# Patient Record
Sex: Female | Born: 1950
Health system: Southern US, Community
[De-identification: ages and names within clinical notes are randomized; demographics above are authoritative.]

## PROBLEM LIST (undated history)

## (undated) DIAGNOSIS — Z789 Other specified health status: Secondary | ICD-10-CM

## (undated) DIAGNOSIS — F32A Depression, unspecified: Secondary | ICD-10-CM

## (undated) DIAGNOSIS — I10 Essential (primary) hypertension: Secondary | ICD-10-CM

## (undated) DIAGNOSIS — E785 Hyperlipidemia, unspecified: Secondary | ICD-10-CM

## (undated) HISTORY — DX: Essential (primary) hypertension: I10

## (undated) HISTORY — DX: Hyperlipidemia, unspecified: E78.5

## (undated) HISTORY — DX: Depression, unspecified: F32.A

---

## 2018-10-07 DIAGNOSIS — M26609 Unspecified temporomandibular joint disorder, unspecified side: Secondary | ICD-10-CM | POA: Diagnosis not present

## 2018-10-07 DIAGNOSIS — Z720 Tobacco use: Secondary | ICD-10-CM | POA: Diagnosis not present

## 2018-10-07 DIAGNOSIS — G501 Atypical facial pain: Secondary | ICD-10-CM | POA: Diagnosis not present

## 2018-10-07 DIAGNOSIS — H9209 Otalgia, unspecified ear: Secondary | ICD-10-CM | POA: Diagnosis not present

## 2019-02-11 DIAGNOSIS — H2513 Age-related nuclear cataract, bilateral: Secondary | ICD-10-CM | POA: Diagnosis not present

## 2019-05-16 ENCOUNTER — Ambulatory Visit: Admit: 2019-05-16 | Payer: Self-pay | Admitting: Ophthalmology

## 2019-05-16 SURGERY — PHACOEMULSIFICATION, CATARACT, WITH IOL INSERTION
Anesthesia: Topical | Laterality: Left

## 2019-06-27 DIAGNOSIS — H2512 Age-related nuclear cataract, left eye: Secondary | ICD-10-CM | POA: Diagnosis not present

## 2019-06-27 DIAGNOSIS — R0600 Dyspnea, unspecified: Secondary | ICD-10-CM | POA: Diagnosis not present

## 2019-07-07 ENCOUNTER — Other Ambulatory Visit: Payer: Self-pay

## 2019-07-07 ENCOUNTER — Encounter: Payer: Self-pay | Admitting: *Deleted

## 2019-07-07 NOTE — Discharge Instructions (Signed)

## 2019-07-12 ENCOUNTER — Other Ambulatory Visit
Admission: RE | Admit: 2019-07-12 | Discharge: 2019-07-12 | Disposition: A | Discharge status: Home / Self Care | Payer: Medicare Other | Source: Ambulatory Visit | Attending: Ophthalmology | Admitting: Ophthalmology

## 2019-07-12 ENCOUNTER — Other Ambulatory Visit: Payer: Self-pay

## 2019-07-12 DIAGNOSIS — Z1159 Encounter for screening for other viral diseases: Secondary | ICD-10-CM | POA: Insufficient documentation

## 2019-07-12 LAB — SARS CORONAVIRUS 2 (TAT 6-24 HRS): SARS Coronavirus 2: NEGATIVE

## 2019-07-15 ENCOUNTER — Other Ambulatory Visit: Payer: Self-pay

## 2019-07-15 ENCOUNTER — Ambulatory Visit
Admission: RE | Admit: 2019-07-15 | Discharge: 2019-07-15 | Disposition: A | Discharge status: Home / Self Care | Payer: Medicare Other | Attending: Ophthalmology | Admitting: Ophthalmology

## 2019-07-15 ENCOUNTER — Ambulatory Visit: Payer: Medicare Other | Admitting: Anesthesiology

## 2019-07-15 ENCOUNTER — Encounter
Admission: RE | Disposition: A | Discharge status: Home / Self Care | Payer: Self-pay | Source: Home / Self Care | Attending: Ophthalmology

## 2019-07-15 DIAGNOSIS — F17200 Nicotine dependence, unspecified, uncomplicated: Secondary | ICD-10-CM | POA: Diagnosis not present

## 2019-07-15 DIAGNOSIS — H2512 Age-related nuclear cataract, left eye: Secondary | ICD-10-CM | POA: Diagnosis not present

## 2019-07-15 DIAGNOSIS — H25812 Combined forms of age-related cataract, left eye: Secondary | ICD-10-CM | POA: Diagnosis not present

## 2019-07-15 HISTORY — PX: CATARACT EXTRACTION W/PHACO: SHX586

## 2019-07-15 HISTORY — DX: Other specified health status: Z78.9

## 2019-07-15 SURGERY — PHACOEMULSIFICATION, CATARACT, WITH IOL INSERTION
Anesthesia: Monitor Anesthesia Care | Site: Eye | Laterality: Left

## 2019-07-15 MED ORDER — ARMC OPHTHALMIC DILATING DROPS
1.0000 "application " | OPHTHALMIC | Status: DC | PRN
  Administered 2019-07-15 (×3): 1 via OPHTHALMIC

## 2019-07-15 MED ORDER — SODIUM HYALURONATE 10 MG/ML IO SOLN
INTRAOCULAR | Status: DC | PRN
  Administered 2019-07-15: 0.55 mL via INTRAOCULAR

## 2019-07-15 MED ORDER — TETRACAINE HCL 0.5 % OP SOLN
1.0000 [drp] | OPHTHALMIC | Status: DC | PRN
  Administered 2019-07-15 (×3): 1 [drp] via OPHTHALMIC

## 2019-07-15 MED ORDER — MIDAZOLAM HCL 2 MG/2ML IJ SOLN
INTRAMUSCULAR | Status: DC | PRN
  Administered 2019-07-15: 2 mg via INTRAVENOUS

## 2019-07-15 MED ORDER — SODIUM HYALURONATE 23 MG/ML IO SOLN
INTRAOCULAR | Status: DC | PRN
  Administered 2019-07-15: 0.6 mL via INTRAOCULAR

## 2019-07-15 MED ORDER — EPINEPHRINE PF 1 MG/ML IJ SOLN
INTRAOCULAR | Status: DC | PRN
  Administered 2019-07-15: 98 mL via OPHTHALMIC

## 2019-07-15 MED ORDER — LIDOCAINE HCL (PF) 2 % IJ SOLN
INTRAOCULAR | Status: DC | PRN
  Administered 2019-07-15: 1 mL via INTRAOCULAR

## 2019-07-15 MED ORDER — MOXIFLOXACIN HCL 0.5 % OP SOLN
OPHTHALMIC | Status: DC | PRN
  Administered 2019-07-15: 0.2 mL via OPHTHALMIC

## 2019-07-15 MED ORDER — FENTANYL CITRATE (PF) 100 MCG/2ML IJ SOLN
INTRAMUSCULAR | Status: DC | PRN
  Administered 2019-07-15 (×2): 50 ug via INTRAVENOUS

## 2019-07-15 SURGICAL SUPPLY — 19 items
CANNULA ANT/CHMB 27G (MISCELLANEOUS) ×2 IMPLANT
CANNULA ANT/CHMB 27GA (MISCELLANEOUS) ×6 IMPLANT
DISSECTOR HYDRO NUCLEUS 50X22 (MISCELLANEOUS) ×3 IMPLANT
GLOVE SURG LX 7.5 STRW (GLOVE) ×2
GLOVE SURG LX STRL 7.5 STRW (GLOVE) ×1 IMPLANT
GLOVE SURG SYN 8.5  E (GLOVE) ×2
GLOVE SURG SYN 8.5 E (GLOVE) ×1 IMPLANT
GLOVE SURG SYN 8.5 PF PI (GLOVE) ×1 IMPLANT
GOWN STRL REUS W/ TWL LRG LVL3 (GOWN DISPOSABLE) ×2 IMPLANT
GOWN STRL REUS W/TWL LRG LVL3 (GOWN DISPOSABLE) ×4
LENS IOL TECNIS ITEC 17.0 (Intraocular Lens) ×2 IMPLANT
MARKER SKIN DUAL TIP RULER LAB (MISCELLANEOUS) ×3 IMPLANT
PACK DR. KING ARMS (PACKS) ×3 IMPLANT
PACK EYE AFTER SURG (MISCELLANEOUS) ×3 IMPLANT
PACK OPTHALMIC (MISCELLANEOUS) ×3 IMPLANT
SYR 3ML LL SCALE MARK (SYRINGE) ×3 IMPLANT
SYR TB 1ML LUER SLIP (SYRINGE) ×3 IMPLANT
WATER STERILE IRR 250ML POUR (IV SOLUTION) ×3 IMPLANT
WIPE NON LINTING 3.25X3.25 (MISCELLANEOUS) ×3 IMPLANT

## 2019-07-15 NOTE — Anesthesia Procedure Notes (Signed)
Procedure Name: MAC Performed by: Jamiah Homeyer, CRNA Pre-anesthesia Checklist: Patient identified, Emergency Drugs available, Suction available, Timeout performed and Patient being monitored Patient Re-evaluated:Patient Re-evaluated prior to induction Oxygen Delivery Method: Nasal cannula Placement Confirmation: positive ETCO2       

## 2019-07-15 NOTE — Transfer of Care (Signed)
Immediate Anesthesia Transfer of Care Note  Patient: Suzanne Richardson  Procedure(s) Performed: CATARACT EXTRACTION PHACO AND INTRAOCULAR LENS PLACEMENT (IOC) LEFT (Left Eye)  Patient Location: PACU  Anesthesia Type: MAC  Level of Consciousness: awake, alert  and patient cooperative  Airway and Oxygen Therapy: Patient Spontanous Breathing and Patient connected to supplemental oxygen  Post-op Assessment: Post-op Vital signs reviewed, Patient's Cardiovascular Status Stable, Respiratory Function Stable, Patent Airway and No signs of Nausea or vomiting  Post-op Vital Signs: Reviewed and stable  Complications: No apparent anesthesia complications

## 2019-07-15 NOTE — Op Note (Signed)
OPERATIVE NOTE  Suzanne Richardson [id number] 07/15/2019   PREOPERATIVE DIAGNOSIS:  Nuclear sclerotic cataract left eye.  H25.12   POSTOPERATIVE DIAGNOSIS:    Nuclear sclerotic cataract left eye.     PROCEDURE:  Phacoemusification with posterior chamber intraocular lens placement of the left eye   LENS:   Implant Name Type Inv. Item Serial No. Manufacturer Lot No. LRB No. Used Action  LENS IOL DIOP 17.0 - [id number] Intraocular Lens LENS IOL DIOP 17.0 [id number] AMO  Left 1 Wasted  LENS IOL DIOP 17.0 - [id number] Intraocular Lens LENS IOL DIOP 17.0 [id number] AMO  Left 1 Implanted       PCB00 +17.0   ULTRASOUND TIME: 1 minutes 45 seconds.  CDE 21.19   SURGEON:  Benay Pillow, MD, MPH   ANESTHESIA:  Topical with tetracaine drops augmented with 1% preservative-free intracameral lidocaine.  ESTIMATED BLOOD LOSS: <1 mL   COMPLICATIONS:  None.   DESCRIPTION OF PROCEDURE:  The patient was identified in the holding room and transported to the operating room and placed in the supine position under the operating microscope.  The left eye was identified as the operative eye and it was prepped and draped in the usual sterile ophthalmic fashion.   A 1.0 millimeter clear-corneal paracentesis was made at the 5:00 position. 0.5 ml of preservative-free 1% lidocaine with epinephrine was injected into the anterior chamber.  The anterior chamber was filled with Healon 5 viscoelastic.  A 2.4 millimeter keratome was used to make a near-clear corneal incision at the 2:00 position.  A curvilinear capsulorrhexis was made with a cystotome and capsulorrhexis forceps.  Balanced salt solution was used to hydrodissect and hydrodelineate the nucleus.   Phacoemulsification was then used in stop and chop fashion to remove the lens nucleus and epinucleus.  The remaining cortex was then removed using the irrigation and aspiration handpiece. Healon was then placed into the capsular bag to distend it for lens placement.  A  lens was then injected into the capsular bag.  The remaining viscoelastic was aspirated.   Wounds were hydrated with balanced salt solution.  The anterior chamber was inflated to a physiologic pressure with balanced salt solution.  Intracameral vigamox 0.1 mL undiltued was injected into the eye and a drop placed onto the ocular surface.  No wound leaks were noted.  The patient was taken to the recovery room in stable condition without complications of anesthesia or surgery  Benay Pillow 07/15/2019, 10:58 AM

## 2019-07-15 NOTE — Anesthesia Postprocedure Evaluation (Signed)
Anesthesia Post Note  Patient: Suzanne Richardson  Procedure(s) Performed: CATARACT EXTRACTION PHACO AND INTRAOCULAR LENS PLACEMENT (IOC) LEFT (Left Eye)  Patient location during evaluation: PACU Anesthesia Type: MAC Level of consciousness: awake and alert Pain management: pain level controlled Vital Signs Assessment: post-procedure vital signs reviewed and stable Respiratory status: spontaneous breathing, nonlabored ventilation, respiratory function stable and patient connected to nasal cannula oxygen Cardiovascular status: stable and blood pressure returned to baseline Postop Assessment: no apparent nausea or vomiting Anesthetic complications: no    Trecia Rogers

## 2019-07-15 NOTE — H&P (Signed)

## 2019-07-15 NOTE — Anesthesia Preprocedure Evaluation (Signed)
Anesthesia Evaluation  Patient identified by MRN, date of birth, ID band Patient awake    Reviewed: Allergy & Precautions, H&P , NPO status , Patient's Chart, lab work & pertinent test results, reviewed documented beta blocker date and time   Airway Mallampati: II  TM Distance: >3 FB Neck ROM: full    Dental no notable dental hx.    Pulmonary Current Smoker,    Pulmonary exam normal breath sounds clear to auscultation       Cardiovascular Exercise Tolerance: Good negative cardio ROS Normal cardiovascular exam Rhythm:regular Rate:Normal     Neuro/Psych negative neurological ROS  negative psych ROS   GI/Hepatic negative GI ROS, Neg liver ROS,   Endo/Other  negative endocrine ROS  Renal/GU negative Renal ROS  negative genitourinary   Musculoskeletal   Abdominal   Peds  Hematology negative hematology ROS (+)   Anesthesia Other Findings   Reproductive/Obstetrics negative OB ROS                             Anesthesia Physical Anesthesia Plan  ASA: II  Anesthesia Plan: MAC   Post-op Pain Management:    Induction:   PONV Risk Score and Plan:   Airway Management Planned:   Additional Equipment:   Intra-op Plan:   Post-operative Plan:   Informed Consent: I have reviewed the patients History and Physical, chart, labs and discussed the procedure including the risks, benefits and alternatives for the proposed anesthesia with the patient or authorized representative who has indicated his/her understanding and acceptance.     Dental Advisory Given  Plan Discussed with: CRNA  Anesthesia Plan Comments:         Anesthesia Quick Evaluation

## 2019-07-22 DIAGNOSIS — H2511 Age-related nuclear cataract, right eye: Secondary | ICD-10-CM | POA: Diagnosis not present

## 2019-07-28 ENCOUNTER — Encounter: Payer: Self-pay | Admitting: *Deleted

## 2019-07-28 ENCOUNTER — Other Ambulatory Visit: Payer: Self-pay

## 2019-08-03 NOTE — Discharge Instructions (Signed)

## 2019-08-04 ENCOUNTER — Other Ambulatory Visit
Admission: RE | Admit: 2019-08-04 | Discharge: 2019-08-04 | Disposition: A | Discharge status: Home / Self Care | Payer: Medicare Other | Source: Ambulatory Visit | Attending: Ophthalmology | Admitting: Ophthalmology

## 2019-08-04 ENCOUNTER — Other Ambulatory Visit: Payer: Self-pay

## 2019-08-04 DIAGNOSIS — Z20828 Contact with and (suspected) exposure to other viral communicable diseases: Secondary | ICD-10-CM | POA: Diagnosis not present

## 2019-08-04 DIAGNOSIS — Z01812 Encounter for preprocedural laboratory examination: Secondary | ICD-10-CM | POA: Diagnosis not present

## 2019-08-04 LAB — SARS CORONAVIRUS 2 (TAT 6-24 HRS): SARS Coronavirus 2: NEGATIVE

## 2019-08-08 ENCOUNTER — Encounter
Admission: RE | Disposition: A | Discharge status: Home / Self Care | Payer: Self-pay | Source: Home / Self Care | Attending: Ophthalmology

## 2019-08-08 ENCOUNTER — Other Ambulatory Visit: Payer: Self-pay

## 2019-08-08 ENCOUNTER — Ambulatory Visit: Payer: Medicare Other | Admitting: Anesthesiology

## 2019-08-08 ENCOUNTER — Ambulatory Visit
Admission: RE | Admit: 2019-08-08 | Discharge: 2019-08-08 | Disposition: A | Discharge status: Home / Self Care | Payer: Medicare Other | Attending: Ophthalmology | Admitting: Ophthalmology

## 2019-08-08 DIAGNOSIS — H25811 Combined forms of age-related cataract, right eye: Secondary | ICD-10-CM | POA: Diagnosis not present

## 2019-08-08 DIAGNOSIS — H2511 Age-related nuclear cataract, right eye: Secondary | ICD-10-CM | POA: Diagnosis not present

## 2019-08-08 DIAGNOSIS — F17200 Nicotine dependence, unspecified, uncomplicated: Secondary | ICD-10-CM | POA: Insufficient documentation

## 2019-08-08 HISTORY — PX: CATARACT EXTRACTION W/PHACO: SHX586

## 2019-08-08 SURGERY — PHACOEMULSIFICATION, CATARACT, WITH IOL INSERTION
Anesthesia: Monitor Anesthesia Care | Site: Eye | Laterality: Right

## 2019-08-08 MED ORDER — SODIUM HYALURONATE 23 MG/ML IO SOLN
INTRAOCULAR | Status: DC | PRN
  Administered 2019-08-08: 0.6 mL via INTRAOCULAR

## 2019-08-08 MED ORDER — MOXIFLOXACIN HCL 0.5 % OP SOLN
OPHTHALMIC | Status: DC | PRN
  Administered 2019-08-08: 0.2 mL via OPHTHALMIC

## 2019-08-08 MED ORDER — ACETAMINOPHEN 160 MG/5ML PO SOLN: 325.0000 mg | ORAL | Status: DC | PRN

## 2019-08-08 MED ORDER — SODIUM HYALURONATE 10 MG/ML IO SOLN
INTRAOCULAR | Status: DC | PRN
  Administered 2019-08-08: 0.55 mL via INTRAOCULAR

## 2019-08-08 MED ORDER — LIDOCAINE HCL (PF) 2 % IJ SOLN
INTRAOCULAR | Status: DC | PRN
  Administered 2019-08-08: 1 mL via INTRAOCULAR

## 2019-08-08 MED ORDER — ARMC OPHTHALMIC DILATING DROPS
1.0000 "application " | OPHTHALMIC | Status: DC | PRN
  Administered 2019-08-08 (×3): 1 via OPHTHALMIC

## 2019-08-08 MED ORDER — EPINEPHRINE PF 1 MG/ML IJ SOLN
INTRAOCULAR | Status: DC | PRN
  Administered 2019-08-08: 72 mL via OPHTHALMIC

## 2019-08-08 MED ORDER — MIDAZOLAM HCL 2 MG/2ML IJ SOLN
INTRAMUSCULAR | Status: DC | PRN
  Administered 2019-08-08: 2 mg via INTRAVENOUS

## 2019-08-08 MED ORDER — ACETAMINOPHEN 325 MG PO TABS: 325.0000 mg | ORAL_TABLET | ORAL | Status: DC | PRN

## 2019-08-08 MED ORDER — FENTANYL CITRATE (PF) 100 MCG/2ML IJ SOLN
INTRAMUSCULAR | Status: DC | PRN
  Administered 2019-08-08: 100 ug via INTRAVENOUS

## 2019-08-08 MED ORDER — TETRACAINE HCL 0.5 % OP SOLN
1.0000 [drp] | OPHTHALMIC | Status: DC | PRN
  Administered 2019-08-08 (×3): 1 [drp] via OPHTHALMIC

## 2019-08-08 SURGICAL SUPPLY — 17 items
CANNULA ANT/CHMB 27GA (MISCELLANEOUS) ×6 IMPLANT
DISSECTOR HYDRO NUCLEUS 50X22 (MISCELLANEOUS) ×3 IMPLANT
GLOVE SURG LX 7.5 STRW (GLOVE) ×4
GLOVE SURG LX STRL 7.5 STRW (GLOVE) ×2 IMPLANT
GLOVE SURG SYN 8.5  E (GLOVE) ×2
GLOVE SURG SYN 8.5 E (GLOVE) ×1 IMPLANT
GOWN STRL REUS W/ TWL LRG LVL3 (GOWN DISPOSABLE) ×2 IMPLANT
GOWN STRL REUS W/TWL LRG LVL3 (GOWN DISPOSABLE) ×4
LENS IOL TECNIS ITEC 17.0 (Intraocular Lens) ×3 IMPLANT
MARKER SKIN DUAL TIP RULER LAB (MISCELLANEOUS) ×3 IMPLANT
PACK DR. KING ARMS (PACKS) ×3 IMPLANT
PACK EYE AFTER SURG (MISCELLANEOUS) ×3 IMPLANT
PACK OPTHALMIC (MISCELLANEOUS) ×3 IMPLANT
SYR 3ML LL SCALE MARK (SYRINGE) ×3 IMPLANT
SYR TB 1ML LUER SLIP (SYRINGE) ×3 IMPLANT
WATER STERILE IRR 250ML POUR (IV SOLUTION) ×3 IMPLANT
WIPE NON LINTING 3.25X3.25 (MISCELLANEOUS) ×3 IMPLANT

## 2019-08-08 NOTE — Op Note (Signed)
OPERATIVE NOTE  Suzanne Richardson [id number] 08/08/2019   PREOPERATIVE DIAGNOSIS:  Nuclear sclerotic cataract right eye.  H25.11   POSTOPERATIVE DIAGNOSIS:    Nuclear sclerotic cataract right eye.     PROCEDURE:  Phacoemusification with posterior chamber intraocular lens placement of the right eye   LENS:   Implant Name Type Inv. Item Serial No. Manufacturer Lot No. LRB No. Used Action  LENS IOL DIOP 17.0 - [id number] Intraocular Lens LENS IOL DIOP 17.0 [id number] AMO  Right 1 Implanted       PCB00 +17.0   ULTRASOUND TIME: 0 minutes 50 seconds.  CDE 7.89   SURGEON:  Benay Pillow, MD, MPH  ANESTHESIOLOGIST: Anesthesiologist: Page, Adele Barthel, MD CRNA: Silvana Newness, CRNA   ANESTHESIA:  Topical with tetracaine drops augmented with 1% preservative-free intracameral lidocaine.  ESTIMATED BLOOD LOSS: less than 1 mL.   COMPLICATIONS:  None.   DESCRIPTION OF PROCEDURE:  The patient was identified in the holding room and transported to the operating room and placed in the supine position under the operating microscope.  The right eye was identified as the operative eye and it was prepped and draped in the usual sterile ophthalmic fashion.   A 1.0 millimeter clear-corneal paracentesis was made at the 10:30 position. 0.5 ml of preservative-free 1% lidocaine with epinephrine was injected into the anterior chamber.  The anterior chamber was filled with Healon 5 viscoelastic.  A 2.4 millimeter keratome was used to make a near-clear corneal incision at the 8:00 position.  A curvilinear capsulorrhexis was made with a cystotome and capsulorrhexis forceps.  Balanced salt solution was used to hydrodissect and hydrodelineate the nucleus.   Phacoemulsification was then used in stop and chop fashion to remove the lens nucleus and epinucleus.  The remaining cortex was then removed using the irrigation and aspiration handpiece. Healon was then placed into the capsular bag to distend it for lens placement.   A lens was then injected into the capsular bag.  The remaining viscoelastic was aspirated.   Wounds were hydrated with balanced salt solution.  The anterior chamber was inflated to a physiologic pressure with balanced salt solution.   Intracameral vigamox 0.1 mL undiluted was injected into the eye and a drop placed onto the ocular surface.  No wound leaks were noted.  The patient was taken to the recovery room in stable condition without complications of anesthesia or surgery  Benay Pillow 08/08/2019, 12:08 PM

## 2019-08-08 NOTE — Anesthesia Preprocedure Evaluation (Signed)
Anesthesia Evaluation  Patient identified by MRN, date of birth, ID band Patient awake    Airway Mallampati: II  TM Distance: >3 FB Neck ROM: Full    Dental no notable dental hx.    Pulmonary Current SmokerPatient did not abstain from smoking.,    Pulmonary exam normal        Cardiovascular Exercise Tolerance: Good negative cardio ROS Normal cardiovascular exam     Neuro/Psych negative neurological ROS     GI/Hepatic negative GI ROS, Neg liver ROS,   Endo/Other  negative endocrine ROS  Renal/GU negative Renal ROS     Musculoskeletal   Abdominal   Peds  Hematology negative hematology ROS (+)   Anesthesia Other Findings   Reproductive/Obstetrics                             Anesthesia Physical Anesthesia Plan  ASA: II  Anesthesia Plan: MAC   Post-op Pain Management:    Induction: Intravenous  PONV Risk Score and Plan: 1  Airway Management Planned: Nasal Cannula  Additional Equipment: None  Intra-op Plan:   Post-operative Plan:   Informed Consent: I have reviewed the patients History and Physical, chart, labs and discussed the procedure including the risks, benefits and alternatives for the proposed anesthesia with the patient or authorized representative who has indicated his/her understanding and acceptance.       Plan Discussed with:   Anesthesia Plan Comments:         Anesthesia Quick Evaluation

## 2019-08-08 NOTE — Transfer of Care (Signed)
Immediate Anesthesia Transfer of Care Note  Patient: Suzanne Richardson  Procedure(s) Performed: CATARACT EXTRACTION PHACO AND INTRAOCULAR LENS PLACEMENT (IOC) RIGHT (Right Eye)  Patient Location: PACU  Anesthesia Type: MAC  Level of Consciousness: awake, alert  and patient cooperative  Airway and Oxygen Therapy: Patient Spontanous Breathing and Patient connected to supplemental oxygen  Post-op Assessment: Post-op Vital signs reviewed, Patient's Cardiovascular Status Stable, Respiratory Function Stable, Patent Airway and No signs of Nausea or vomiting  Post-op Vital Signs: Reviewed and stable  Complications: No apparent anesthesia complications

## 2019-08-08 NOTE — H&P (Signed)

## 2019-08-08 NOTE — Anesthesia Postprocedure Evaluation (Signed)
Anesthesia Post Note  Patient: Suzanne Richardson  Procedure(s) Performed: CATARACT EXTRACTION PHACO AND INTRAOCULAR LENS PLACEMENT (IOC) RIGHT (Right Eye)  Patient location during evaluation: PACU Anesthesia Type: MAC Level of consciousness: awake and alert Pain management: pain level controlled Vital Signs Assessment: post-procedure vital signs reviewed and stable Respiratory status: spontaneous breathing, nonlabored ventilation, respiratory function stable and patient connected to nasal cannula oxygen Cardiovascular status: stable and blood pressure returned to baseline Postop Assessment: no apparent nausea or vomiting Anesthetic complications: no    Adele Barthel Carvin Almas

## 2019-08-09 ENCOUNTER — Encounter: Payer: Self-pay | Admitting: Ophthalmology

## 2019-09-27 DIAGNOSIS — D2339 Other benign neoplasm of skin of other parts of face: Secondary | ICD-10-CM | POA: Diagnosis not present

## 2021-02-01 ENCOUNTER — Telehealth: Payer: Self-pay

## 2021-02-01 NOTE — Telephone Encounter (Signed)
Called patient and precharted for her new patient appointment scheduled for 02/05/2021 at 2pm. Confirmed appt time and date. Went over Allergies, Medications, History, Surgeries, Smoking History, and reason for new patient visit.  She is coming in to establish care with a new PCP and discuss elevated Blood Sugar readings for a month now. They are reading between 200s and 300s. She has no past medical problems. Declines all vaccines, and has had no surgeries besides cataracts previously listed in chart before I called her.   She will be here 20 mins before her appt time to check in and verbally agreed to this.   Clista Bernhardt, CMA (AAMA)

## 2021-02-05 ENCOUNTER — Other Ambulatory Visit: Payer: Self-pay

## 2021-02-05 ENCOUNTER — Ambulatory Visit (INDEPENDENT_AMBULATORY_CARE_PROVIDER_SITE_OTHER): Payer: Medicare Other | Admitting: Internal Medicine

## 2021-02-05 ENCOUNTER — Encounter: Payer: Self-pay | Admitting: Internal Medicine

## 2021-02-05 VITALS — BP 108/68 | HR 91 | Temp 97.9°F | Ht 66.0 in | Wt 157.0 lb

## 2021-02-05 DIAGNOSIS — F17201 Nicotine dependence, unspecified, in remission: Secondary | ICD-10-CM | POA: Insufficient documentation

## 2021-02-05 DIAGNOSIS — E119 Type 2 diabetes mellitus without complications: Secondary | ICD-10-CM | POA: Insufficient documentation

## 2021-02-05 DIAGNOSIS — F17200 Nicotine dependence, unspecified, uncomplicated: Secondary | ICD-10-CM | POA: Diagnosis not present

## 2021-02-05 DIAGNOSIS — E118 Type 2 diabetes mellitus with unspecified complications: Secondary | ICD-10-CM | POA: Diagnosis not present

## 2021-02-05 MED ORDER — BUPROPION HCL ER (SR) 150 MG PO TB12: 150.0000 mg | ORAL_TABLET | Freq: Two times a day (BID) | ORAL | 2 refills | Status: DC

## 2021-02-05 MED ORDER — METFORMIN HCL ER 500 MG PO TB24: 1000.0000 mg | ORAL_TABLET | Freq: Every day | ORAL | 3 refills | Status: DC

## 2021-02-05 NOTE — Progress Notes (Signed)
Date:  02/05/2021   Name:  Suzanne Richardson   DOB:  May 13, 1951   MRN:  [id number]   Chief Complaint: Establish Care and Diabetes (For the past 4 to 6 weeks she has been testing her Blood Sugar at home with her husbands glucose monitor and she is getting BS readings between 200 and 300. Patient has never been a diabetic before and came in today to discuss this issue.)  Diabetes She presents for her initial diabetic visit. She has type 2 diabetes mellitus. Pertinent negatives for hypoglycemia include no dizziness, headaches or nervousness/anxiousness. Associated symptoms include weight loss. Pertinent negatives for diabetes include no chest pain, no fatigue, no foot paresthesias, no foot ulcerations, no polyuria and no visual change. Symptoms are stable (weight loss has stablized). When asked about current treatments, none were reported. Her weight is stable. She is following a generally healthy diet. She monitors blood glucose at home 3-4 x per day. Her breakfast blood glucose is taken between 6-7 am. Her breakfast blood glucose range is generally 180-200 mg/dl. Her bedtime blood glucose is taken between 10-11 pm. Her bedtime blood glucose range is generally >200 mg/dl. An ACE inhibitor/angiotensin II receptor blocker is not being taken.  Nicotine Dependence Presents for initial visit. Symptoms include cravings. Symptoms are negative for fatigue. Preferred tobacco types include cigarettes. Her urge triggers include company of smokers. She smokes 1 pack of cigarettes per day. She started smoking when she was 93-66 years old. Past treatments include nothing.    No results found for: CREATININE, BUN, NA, K, CL, CO2 No results found for: CHOL, HDL, LDLCALC, LDLDIRECT, TRIG, CHOLHDL No results found for: TSH No results found for: HGBA1C No results found for: WBC, HGB, HCT, MCV, PLT No results found for: ALT, AST, GGT, ALKPHOS, BILITOT   Review of Systems  Constitutional: Positive for unexpected weight  change and weight loss. Negative for chills and fatigue.  Respiratory: Negative for cough, chest tightness, shortness of breath and wheezing.   Cardiovascular: Negative for chest pain, palpitations and leg swelling.  Gastrointestinal: Negative for abdominal pain, constipation and diarrhea.  Endocrine: Negative for polyuria.  Genitourinary: Negative for dysuria, frequency and urgency.  Musculoskeletal: Negative for arthralgias and gait problem.  Neurological: Negative for dizziness and headaches.  Psychiatric/Behavioral: Negative for dysphoric mood and sleep disturbance. The patient is not nervous/anxious.     There are no problems to display for this patient.   No Known Allergies  Past Surgical History:  Procedure Laterality Date   CATARACT EXTRACTION W/PHACO Left 07/15/2019   Procedure: CATARACT EXTRACTION PHACO AND INTRAOCULAR LENS PLACEMENT (IOC) LEFT;  Surgeon: Eulogio Bear, MD;  Location: [place];  Service: Ophthalmology;  Laterality: Left;   CATARACT EXTRACTION W/PHACO Right 08/08/2019   Procedure: CATARACT EXTRACTION PHACO AND INTRAOCULAR LENS PLACEMENT (IOC) RIGHT;  Surgeon: Eulogio Bear, MD;  Location: [facility];  Service: Ophthalmology;  Laterality: Right;    Social History   Tobacco Use   Smoking status: Current Every Day Smoker    Packs/day: 1.00    Years: 60.00    Pack years: 60.00    Types: Cigarettes   Smokeless tobacco: Never Used  Vaping Use   Vaping Use: Never used  Substance Use Topics   Alcohol use: Not Currently   Drug use: Never     Medication list has been reviewed and updated.  Current Meds  Medication Sig   Multiple Vitamin (MULTIVITAMIN) tablet Take 1 tablet by mouth daily.  PHQ 2/9 Scores 02/05/2021  PHQ - 2 Score 0  PHQ- 9 Score 1    GAD 7 : Generalized Anxiety Score 02/05/2021  Nervous, Anxious, on Edge 0  Control/stop worrying 0  Worry too much - different things 0  Trouble relaxing 0   Restless 0  Easily annoyed or irritable 0  Afraid - awful might happen 0  Total GAD 7 Score 0  Anxiety Difficulty Not difficult at all    BP Readings from Last 3 Encounters:  02/05/21 108/68  08/08/19 128/78  07/15/19 98/80    Physical Exam Vitals and nursing note reviewed.  Constitutional:      General: She is not in acute distress.    Appearance: Normal appearance. She is well-developed.  HENT:     Head: Normocephalic and atraumatic.  Neck:     Vascular: No carotid bruit.  Cardiovascular:     Rate and Rhythm: Normal rate and regular rhythm.     Pulses: Normal pulses.          Dorsalis pedis pulses are 2+ on the right side and 2+ on the left side.       Posterior tibial pulses are 2+ on the right side and 2+ on the left side.     Heart sounds: No murmur heard.   Pulmonary:     Effort: Pulmonary effort is normal. No respiratory distress.     Breath sounds: No wheezing or rhonchi.  Musculoskeletal:     Cervical back: Normal range of motion.     Right lower leg: No edema.     Left lower leg: No edema.  Feet:     Right foot:     Skin integrity: Skin integrity normal.     Left foot:     Skin integrity: Skin integrity normal.  Lymphadenopathy:     Cervical: No cervical adenopathy.  Skin:    General: Skin is warm and dry.     Findings: No rash.  Neurological:     General: No focal deficit present.     Mental Status: She is alert and oriented to person, place, and time.  Psychiatric:        Mood and Affect: Mood and affect and mood normal.        Behavior: Behavior normal.     Wt Readings from Last 3 Encounters:  02/05/21 157 lb (71.2 kg)  08/08/19 171 lb (77.6 kg)  07/15/19 173 lb (78.5 kg)    BP 108/68    Pulse 91    Temp 97.9 F (36.6 C) (Oral)    Ht 5\' 6"  (1.676 m)    Wt 157 lb (71.2 kg)    SpO2 96%    BMI 25.34 kg/m   Assessment and Plan: 1. Type II diabetes mellitus with complication (HCC) New onset 1-2 months ago Start metformin; Pt urged to  schedule DM eye exam Continue FSBS 1-2 times per day Will advise on statin medication after labs return She declines all vaccinations - CBC with Differential/Platelet - Comprehensive metabolic panel - Hemoglobin A1c - Lipid panel - TSH - metFORMIN (GLUCOPHAGE-XR) 500 MG 24 hr tablet; Take 2 tablets (1,000 mg total) by mouth daily with breakfast.  Dispense: 60 tablet; Refill: 3  2. Tobacco use disorder Chantix is off the market (pt wanted to try this) so will try Bupropion instead Discussed LDCT lung cancer screening - she will consider - buPROPion (WELLBUTRIN SR) 150 MG 12 hr tablet; Take 1 tablet (150 mg total) by mouth  2 (two) times daily.  Dispense: 60 tablet; Refill: 2   Partially dictated using Editor, commissioning. Any errors are unintentional.  Halina Maidens, MD [facility]  02/05/2021

## 2021-02-05 NOTE — Patient Instructions (Addendum)
Get an appt with Dr. Edison Pace for diabetic eye exam.  Start metformin once a day in the morning - after a week increase to 2 per day (can take together if desired)  Wait to start the Bupropion until you know that you can take the metformin.

## 2021-02-06 LAB — CBC WITH DIFFERENTIAL/PLATELET
Basophils Absolute: 0 10*3/uL (ref 0.0–0.2)
Basos: 0 %
EOS (ABSOLUTE): 0.2 10*3/uL (ref 0.0–0.4)
Eos: 1 %
Hematocrit: 44.9 % (ref 34.0–46.6)
Hemoglobin: 15.3 g/dL (ref 11.1–15.9)
Immature Grans (Abs): 0.1 10*3/uL (ref 0.0–0.1)
Immature Granulocytes: 1 %
Lymphocytes Absolute: 3.2 10*3/uL — ABNORMAL HIGH (ref 0.7–3.1)
Lymphs: 24 %
MCH: 30.1 pg (ref 26.6–33.0)
MCHC: 34.1 g/dL (ref 31.5–35.7)
MCV: 88 fL (ref 79–97)
Monocytes Absolute: 0.6 10*3/uL (ref 0.1–0.9)
Monocytes: 4 %
Neutrophils Absolute: 9.4 10*3/uL — ABNORMAL HIGH (ref 1.4–7.0)
Neutrophils: 70 %
Platelets: 304 10*3/uL (ref 150–450)
RBC: 5.08 x10E6/uL (ref 3.77–5.28)
RDW: 12.2 % (ref 11.7–15.4)
WBC: 13.4 10*3/uL — ABNORMAL HIGH (ref 3.4–10.8)

## 2021-02-06 LAB — COMPREHENSIVE METABOLIC PANEL
ALT: 18 IU/L (ref 0–32)
AST: 13 IU/L (ref 0–40)
Albumin/Globulin Ratio: 1.4 (ref 1.2–2.2)
Albumin: 4.3 g/dL (ref 3.7–4.7)
Alkaline Phosphatase: 107 IU/L (ref 44–121)
BUN/Creatinine Ratio: 28 (ref 12–28)
BUN: 16 mg/dL (ref 8–27)
Bilirubin Total: <0.2 mg/dL (ref 0.0–1.2)
CO2: 24 mmol/L (ref 20–29)
Calcium: 9.8 mg/dL (ref 8.7–10.3)
Chloride: 96 mmol/L (ref 96–106)
Creatinine, Ser: 0.57 mg/dL (ref 0.57–1.00)
GFR calc Af Amer: 107 mL/min/{1.73_m2} (ref >59)
GFR calc non Af Amer: 93 mL/min/{1.73_m2} (ref >59)
Globulin, Total: 3.1 g/dL (ref 1.5–4.5)
Glucose: 247 mg/dL — ABNORMAL HIGH (ref 65–99)
Potassium: 4.1 mmol/L (ref 3.5–5.2)
Sodium: 137 mmol/L (ref 134–144)
Total Protein: 7.4 g/dL (ref 6.0–8.5)

## 2021-02-06 LAB — HEMOGLOBIN A1C
Est. average glucose Bld gHb Est-mCnc: 278 mg/dL
Hgb A1c MFr Bld: 11.3 % — ABNORMAL HIGH (ref 4.8–5.6)

## 2021-02-06 LAB — LIPID PANEL
Chol/HDL Ratio: 4.8 ratio — ABNORMAL HIGH (ref 0.0–4.4)
Cholesterol, Total: 244 mg/dL — ABNORMAL HIGH (ref 100–199)
HDL: 51 mg/dL (ref >39)
LDL Chol Calc (NIH): 144 mg/dL — ABNORMAL HIGH (ref 0–99)
Triglycerides: 273 mg/dL — ABNORMAL HIGH (ref 0–149)
VLDL Cholesterol Cal: 49 mg/dL — ABNORMAL HIGH (ref 5–40)

## 2021-02-06 LAB — TSH: TSH: 1.18 u[IU]/mL (ref 0.450–4.500)

## 2021-02-26 ENCOUNTER — Telehealth: Payer: Self-pay | Admitting: Internal Medicine

## 2021-02-26 NOTE — Telephone Encounter (Signed)
Left message for patient to call back and schedule Medicare Annual Wellness Visit (AWV) either virtually or in office. Whichever the patients preference is.  No history of AWV; please schedule at anytime with [facility] Health Advisor.  This should be a 40 minute visit  AWV-I PER [place] 02/27/2015

## 2021-04-23 ENCOUNTER — Ambulatory Visit: Payer: Medicare Other | Admitting: Internal Medicine

## 2021-05-08 ENCOUNTER — Other Ambulatory Visit: Payer: Self-pay

## 2021-05-08 ENCOUNTER — Ambulatory Visit (INDEPENDENT_AMBULATORY_CARE_PROVIDER_SITE_OTHER): Payer: Medicare Other | Admitting: Internal Medicine

## 2021-05-08 ENCOUNTER — Encounter: Payer: Self-pay | Admitting: Internal Medicine

## 2021-05-08 VITALS — BP 122/70 | HR 83 | Temp 98.1°F | Ht 66.0 in | Wt 156.0 lb

## 2021-05-08 DIAGNOSIS — E785 Hyperlipidemia, unspecified: Secondary | ICD-10-CM | POA: Diagnosis not present

## 2021-05-08 DIAGNOSIS — E1169 Type 2 diabetes mellitus with other specified complication: Secondary | ICD-10-CM | POA: Diagnosis not present

## 2021-05-08 DIAGNOSIS — E118 Type 2 diabetes mellitus with unspecified complications: Secondary | ICD-10-CM

## 2021-05-08 DIAGNOSIS — Z23 Encounter for immunization: Secondary | ICD-10-CM

## 2021-05-08 DIAGNOSIS — F17200 Nicotine dependence, unspecified, uncomplicated: Secondary | ICD-10-CM

## 2021-05-08 MED ORDER — BUPROPION HCL ER (SR) 150 MG PO TB12: 150.0000 mg | ORAL_TABLET | Freq: Two times a day (BID) | ORAL | 1 refills | Status: DC

## 2021-05-08 MED ORDER — METFORMIN HCL ER 500 MG PO TB24: 1000.0000 mg | ORAL_TABLET | Freq: Every day | ORAL | 1 refills | Status: DC

## 2021-05-08 MED ORDER — ATORVASTATIN CALCIUM 10 MG PO TABS: 10.0000 mg | ORAL_TABLET | Freq: Every day | ORAL | 1 refills | Status: DC

## 2021-05-08 NOTE — Patient Instructions (Signed)
Schedule annual eye exams  Start a cholesterol medications

## 2021-05-08 NOTE — Progress Notes (Signed)
Date:  05/08/2021   Name:  Suzanne Richardson   DOB:  10-Jun-1951   MRN:  [id number]   Chief Complaint: Diabetes (Last BS 118 this morning )  Diabetes She presents for her follow-up diabetic visit. She has type 2 diabetes mellitus. The initial diagnosis of diabetes was made 3 months ago. Her disease course has been improving. Pertinent negatives for hypoglycemia include no headaches, nervousness/anxiousness or tremors. Pertinent negatives for diabetes include no chest pain, no fatigue, no foot paresthesias, no polydipsia, no polyuria, no visual change and no weight loss. Current diabetic treatment includes oral agent (monotherapy). She is compliant with treatment all of the time. She is following a generally healthy diet. She monitors blood glucose at home 1-2 x per day. Her home blood glucose trend is decreasing steadily. Her breakfast blood glucose is taken between 6-7 am. Her breakfast blood glucose range is generally 110-130 mg/dl. An ACE inhibitor/angiotensin II receptor blocker is not being taken. Eye exam is not current.  Hyperlipidemia This is a chronic problem. The problem is uncontrolled. Pertinent negatives include no chest pain or shortness of breath. She is currently on no antihyperlipidemic treatment.  Tobacco use - she continues to smoke.  She has minimal SOB, worse with exertion,  No chronic cough or sputum production.  She is interested in LDCT screening.   Lab Results  Component Value Date   CREATININE 0.57 02/05/2021   BUN 16 02/05/2021   NA 137 02/05/2021   K 4.1 02/05/2021   CL 96 02/05/2021   CO2 24 02/05/2021   Lab Results  Component Value Date   CHOL 244 (H) 02/05/2021   HDL 51 02/05/2021   LDLCALC 144 (H) 02/05/2021   TRIG 273 (H) 02/05/2021   CHOLHDL 4.8 (H) 02/05/2021   Lab Results  Component Value Date   TSH 1.180 02/05/2021   Lab Results  Component Value Date   HGBA1C 11.3 (H) 02/05/2021   Lab Results  Component Value Date   WBC 13.4 (H) 02/05/2021    HGB 15.3 02/05/2021   HCT 44.9 02/05/2021   MCV 88 02/05/2021   PLT 304 02/05/2021   Lab Results  Component Value Date   ALT 18 02/05/2021   AST 13 02/05/2021   ALKPHOS 107 02/05/2021   BILITOT <0.2 02/05/2021     Review of Systems  Constitutional: Negative for appetite change, fatigue, fever, unexpected weight change and weight loss.  HENT: Negative for tinnitus and trouble swallowing.   Eyes: Negative for visual disturbance.  Respiratory: Negative for cough, chest tightness and shortness of breath.   Cardiovascular: Negative for chest pain, palpitations and leg swelling.  Gastrointestinal: Negative for abdominal pain.  Endocrine: Negative for polydipsia and polyuria.  Genitourinary: Negative for dysuria and hematuria.  Musculoskeletal: Negative for arthralgias.  Skin: Negative for color change and wound.  Neurological: Negative for tremors, numbness and headaches.  Psychiatric/Behavioral: Negative for dysphoric mood and sleep disturbance. The patient is not nervous/anxious.     Patient Active Problem List   Diagnosis Date Noted  . Hyperlipidemia associated with type 2 diabetes mellitus ([place]) 05/08/2021  . Type II diabetes mellitus with complication ([place]) 00/86/7619  . Tobacco use disorder 02/05/2021    No Known Allergies  Past Surgical History:  Procedure Laterality Date  . CATARACT EXTRACTION W/PHACO Left 07/15/2019   Procedure: CATARACT EXTRACTION PHACO AND INTRAOCULAR LENS PLACEMENT ([place]) LEFT;  Surgeon: Eulogio Bear, MD;  Location: [place];  Service: Ophthalmology;  Laterality: Left;  . CATARACT  EXTRACTION W/PHACO Right 08/08/2019   Procedure: CATARACT EXTRACTION PHACO AND INTRAOCULAR LENS PLACEMENT ([facility]) RIGHT;  Surgeon: Eulogio Bear, MD;  Location: [place];  Service: Ophthalmology;  Laterality: Right;    Social History   Tobacco Use  . Smoking status: Current Every Day Smoker    Packs/day: 1.00    Years: 60.00    Pack  years: 60.00    Types: Cigarettes  . Smokeless tobacco: Never Used  Vaping Use  . Vaping Use: Never used  Substance Use Topics  . Alcohol use: Not Currently  . Drug use: Never     Medication list has been reviewed and updated.  Current Meds  Medication Sig  . buPROPion (WELLBUTRIN SR) 150 MG 12 hr tablet Take 1 tablet (150 mg total) by mouth 2 (two) times daily.  . metFORMIN (GLUCOPHAGE-XR) 500 MG 24 hr tablet Take 2 tablets (1,000 mg total) by mouth daily with breakfast.  . Multiple Vitamin (MULTIVITAMIN) tablet Take 1 tablet by mouth daily.    PHQ 2/9 Scores 05/08/2021 02/05/2021  PHQ - 2 Score 0 0  PHQ- 9 Score 0 1    GAD 7 : Generalized Anxiety Score 05/08/2021 02/05/2021  Nervous, Anxious, on Edge 0 0  Control/stop worrying 0 0  Worry too much - different things 0 0  Trouble relaxing 0 0  Restless 0 0  Easily annoyed or irritable 0 0  Afraid - awful might happen 0 0  Total GAD 7 Score 0 0  Anxiety Difficulty - Not difficult at all    BP Readings from Last 3 Encounters:  05/08/21 122/70  02/05/21 108/68  08/08/19 128/78    Physical Exam Vitals and nursing note reviewed.  Constitutional:      General: She is not in acute distress.    Appearance: She is well-developed.  HENT:     Head: Normocephalic and atraumatic.  Cardiovascular:     Rate and Rhythm: Normal rate and regular rhythm.     Pulses: Normal pulses.  Pulmonary:     Effort: Pulmonary effort is normal. No respiratory distress.     Breath sounds: Decreased breath sounds present. No wheezing or rhonchi.  Musculoskeletal:     Cervical back: Normal range of motion.     Right lower leg: No edema.     Left lower leg: No edema.  Skin:    General: Skin is warm and dry.     Capillary Refill: Capillary refill takes less than 2 seconds.     Findings: No rash.  Neurological:     Mental Status: She is alert and oriented to person, place, and time.  Psychiatric:        Attention and Perception: Attention  normal.        Mood and Affect: Mood normal.        Behavior: Behavior normal.     Wt Readings from Last 3 Encounters:  05/08/21 156 lb (70.8 kg)  02/05/21 157 lb (71.2 kg)  08/08/19 171 lb (77.6 kg)    BP 122/70   Pulse 83   Temp 98.1 F (36.7 C) (Oral)   Ht 5\' 6"  (1.676 m)   Wt 156 lb (70.8 kg)   SpO2 95%   BMI 25.18 kg/m   Assessment and Plan: 1. Type II diabetes mellitus with complication (HCC) BS improved with addition of metformin which is tolerated without side effects Encourage dietary changes as well. Yearly Eye exam recommended - Hemoglobin A1c - Comprehensive metabolic panel - metFORMIN (  GLUCOPHAGE-XR) 500 MG 24 hr tablet; Take 2 tablets (1,000 mg total) by mouth daily with breakfast.  Dispense: 180 tablet; Refill: 1  2. Hyperlipidemia associated with type 2 diabetes mellitus (HCC) Begin statin therapy - atorvastatin (LIPITOR) 10 MG tablet; Take 1 tablet (10 mg total) by mouth daily.  Dispense: 90 tablet; Refill: 1  3. Need for vaccination for pneumococcus Pt declined vaccinations  4. Tobacco use disorder Refer for LDCT screening - buPROPion (WELLBUTRIN SR) 150 MG 12 hr tablet; Take 1 tablet (150 mg total) by mouth 2 (two) times daily.  Dispense: 180 tablet; Refill: 1 - Ambulatory Referral for Lung Cancer Screening   Partially dictated using Dragon software. Any errors are unintentional.  Halina Maidens, MD [facility]  05/08/2021

## 2021-05-09 LAB — COMPREHENSIVE METABOLIC PANEL
ALT: 26 IU/L (ref 0–32)
AST: 19 IU/L (ref 0–40)
Albumin/Globulin Ratio: 1.3 (ref 1.2–2.2)
Albumin: 4 g/dL (ref 3.7–4.7)
Alkaline Phosphatase: 87 IU/L (ref 44–121)
BUN/Creatinine Ratio: 20 (ref 12–28)
BUN: 13 mg/dL (ref 8–27)
Bilirubin Total: <0.2 mg/dL (ref 0.0–1.2)
CO2: 22 mmol/L (ref 20–29)
Calcium: 9.8 mg/dL (ref 8.7–10.3)
Chloride: 101 mmol/L (ref 96–106)
Creatinine, Ser: 0.64 mg/dL (ref 0.57–1.00)
Globulin, Total: 3.2 g/dL (ref 1.5–4.5)
Glucose: 132 mg/dL — ABNORMAL HIGH (ref 65–99)
Potassium: 3.9 mmol/L (ref 3.5–5.2)
Sodium: 139 mmol/L (ref 134–144)
Total Protein: 7.2 g/dL (ref 6.0–8.5)
eGFR: 94 mL/min/{1.73_m2} (ref >59)

## 2021-05-09 LAB — HEMOGLOBIN A1C
Est. average glucose Bld gHb Est-mCnc: 177 mg/dL
Hgb A1c MFr Bld: 7.8 % — ABNORMAL HIGH (ref 4.8–5.6)

## 2021-06-05 ENCOUNTER — Telehealth: Payer: Self-pay | Admitting: *Deleted

## 2021-06-05 NOTE — Telephone Encounter (Signed)
Received referral for low dose lung cancer screening CT scan. Message left at phone number listed in EMR for patient to call me back to facilitate scheduling scan.  

## 2021-06-10 ENCOUNTER — Telehealth: Payer: Self-pay | Admitting: *Deleted

## 2021-06-10 ENCOUNTER — Encounter: Payer: Self-pay | Admitting: *Deleted

## 2021-06-10 DIAGNOSIS — Z87891 Personal history of nicotine dependence: Secondary | ICD-10-CM

## 2021-06-10 DIAGNOSIS — F17200 Nicotine dependence, unspecified, uncomplicated: Secondary | ICD-10-CM

## 2021-06-10 DIAGNOSIS — Z122 Encounter for screening for malignant neoplasm of respiratory organs: Secondary | ICD-10-CM

## 2021-06-10 NOTE — Telephone Encounter (Signed)
Received referral for initial lung cancer screening scan. Contacted patient and obtained smoking history,(current smoker, 1 ppd x 60 yrs) as well as answering questions related to screening process. Patient denies signs of lung cancer such as weight loss or hemoptysis. Patient denies comorbidity that would prevent curative treatment if lung cancer were found. Patient is scheduled for shared decision making visit and CT scan on 06/19/21 @ 10:30am.

## 2021-06-19 ENCOUNTER — Inpatient Hospital Stay: Payer: Medicare Other | Attending: Hospice and Palliative Medicine | Admitting: Hospice and Palliative Medicine

## 2021-06-19 ENCOUNTER — Ambulatory Visit
Admission: RE | Admit: 2021-06-19 | Discharge: 2021-06-19 | Disposition: A | Discharge status: Home / Self Care | Payer: Medicare Other | Source: Ambulatory Visit | Attending: Oncology | Admitting: Oncology

## 2021-06-19 ENCOUNTER — Other Ambulatory Visit: Payer: Self-pay

## 2021-06-19 DIAGNOSIS — Z122 Encounter for screening for malignant neoplasm of respiratory organs: Secondary | ICD-10-CM

## 2021-06-19 DIAGNOSIS — F17200 Nicotine dependence, unspecified, uncomplicated: Secondary | ICD-10-CM | POA: Insufficient documentation

## 2021-06-19 DIAGNOSIS — Z87891 Personal history of nicotine dependence: Secondary | ICD-10-CM

## 2021-06-19 DIAGNOSIS — F17210 Nicotine dependence, cigarettes, uncomplicated: Secondary | ICD-10-CM | POA: Diagnosis not present

## 2021-06-19 NOTE — Progress Notes (Signed)
Virtual Visit via Telephone Note  I connected withNAME@ on 06/19/21 [email or web address] by telephone and verified that I am speaking with the correct person using two identifiers.   I discussed the limitations of evaluation and management by telemedicine and the availability of in person appointments. The patient expressed understanding and agreed to proceed.  Location: Patient: Home Provider: Clinic   In accordance with CMS guidelines, patient has met eligibility criteria including age, absence of signs or symptoms of lung cancer.  Social History   Tobacco Use   Smoking status: Every Day    Packs/day: 1.00    Years: 60.00    Pack years: 60.00    Types: Cigarettes   Smokeless tobacco: Never  Vaping Use   Vaping Use: Never used  Substance Use Topics   Alcohol use: Not Currently   Drug use: Never      A shared decision-making session was conducted prior to the performance of CT scan. This includes one or more decision aids, includes benefits and harms of screening, follow-up diagnostic testing, over-diagnosis, false positive rate, and total radiation exposure.   Counseling on the importance of adherence to annual lung cancer LDCT screening, impact of co-morbidities, and ability or willingness to undergo diagnosis and treatment is imperative for compliance of the program.   Counseling on the importance of continued smoking cessation for former smokers; the importance of smoking cessation for current smokers, and information about tobacco cessation interventions have been given to patient including [place] and 1800 quit [place] programs.   Written order for lung cancer screening with LDCT has been given to the patient and any and all questions have been answered to the best of my abilities.    Yearly follow up will be coordinated by Burgess Estelle, Thoracic Navigator.  Time Total: 5 minutes  Visit consisted of counseling and education dealing with complex health screening.  Greater than 50%  of this time was spent counseling and coordinating care related to the above assessment and plan.  Signed by: Altha Harm, PhD, NP-C

## 2021-07-10 ENCOUNTER — Encounter: Payer: Self-pay | Admitting: *Deleted

## 2021-07-10 ENCOUNTER — Encounter: Payer: Self-pay | Admitting: Internal Medicine

## 2021-07-10 DIAGNOSIS — I700 Atherosclerosis of aorta: Secondary | ICD-10-CM | POA: Insufficient documentation

## 2021-09-03 ENCOUNTER — Telehealth: Payer: Self-pay | Admitting: Internal Medicine

## 2021-09-03 NOTE — Telephone Encounter (Signed)
Copied from [employer] [id number]. Topic: Medicare AWV >> Sep 03, 2021  6:37 PM Cher Nakai R wrote: Reason for CRM:  Left message for patient to call back and schedule Medicare Annual Wellness Visit (AWV) in office.   If unable to come into the office for AWV,  please offer to do virtually or by telephone.  No hx of AWV eligible for AWVI as of 02/27/2015  Please schedule at anytime with [facility] Health Advisor.      40 Minutes appointment   Any questions, please call me at [phone]

## 2021-09-09 ENCOUNTER — Other Ambulatory Visit: Payer: Self-pay | Admitting: Internal Medicine

## 2021-09-09 DIAGNOSIS — E1169 Type 2 diabetes mellitus with other specified complication: Secondary | ICD-10-CM

## 2021-09-13 ENCOUNTER — Other Ambulatory Visit: Payer: Self-pay

## 2021-09-13 ENCOUNTER — Ambulatory Visit (INDEPENDENT_AMBULATORY_CARE_PROVIDER_SITE_OTHER): Payer: Medicare Other | Admitting: Internal Medicine

## 2021-09-13 ENCOUNTER — Encounter: Payer: Self-pay | Admitting: Internal Medicine

## 2021-09-13 VITALS — BP 118/82 | HR 89 | Temp 98.1°F | Ht 66.0 in | Wt 162.0 lb

## 2021-09-13 DIAGNOSIS — E785 Hyperlipidemia, unspecified: Secondary | ICD-10-CM

## 2021-09-13 DIAGNOSIS — E1169 Type 2 diabetes mellitus with other specified complication: Secondary | ICD-10-CM | POA: Diagnosis not present

## 2021-09-13 DIAGNOSIS — F17200 Nicotine dependence, unspecified, uncomplicated: Secondary | ICD-10-CM | POA: Diagnosis not present

## 2021-09-13 DIAGNOSIS — J432 Centrilobular emphysema: Secondary | ICD-10-CM | POA: Diagnosis not present

## 2021-09-13 DIAGNOSIS — R911 Solitary pulmonary nodule: Secondary | ICD-10-CM

## 2021-09-13 DIAGNOSIS — E118 Type 2 diabetes mellitus with unspecified complications: Secondary | ICD-10-CM | POA: Diagnosis not present

## 2021-09-13 DIAGNOSIS — I700 Atherosclerosis of aorta: Secondary | ICD-10-CM

## 2021-09-13 LAB — POCT GLYCOSYLATED HEMOGLOBIN (HGB A1C): Hemoglobin A1C: 7.6 % — AB (ref 4.0–5.6)

## 2021-09-13 NOTE — Patient Instructions (Signed)
Schedule a Diabetic retinal eye exam annually.

## 2021-09-13 NOTE — Progress Notes (Signed)
Date:  09/13/2021   Name:  Suzanne Richardson   DOB:  12-Jun-1951   MRN:  [id number]   Chief Complaint: Diabetes (Last BS 170 this AM) and Hyperlipidemia  Diabetes She presents for her follow-up diabetic visit. She has type 2 diabetes mellitus. Pertinent negatives for hypoglycemia include no headaches or tremors. Pertinent negatives for diabetes include no chest pain, no fatigue, no polydipsia and no polyuria. Symptoms are stable. Current diabetic treatment includes oral agent (monotherapy). She is compliant with treatment all of the time. Her weight is stable. She is following a generally healthy diet. Her breakfast blood glucose is taken between 6-7 am. Her breakfast blood glucose range is generally 110-130 mg/dl. Her dinner blood glucose is taken between 6-7 pm. Her dinner blood glucose range is generally 140-180 mg/dl. An ACE inhibitor/angiotensin II receptor blocker is not being taken. Eye exam is not current.  Hyperlipidemia This is a chronic problem. The problem is uncontrolled. Pertinent negatives include no chest pain or shortness of breath. Current antihyperlipidemic treatment includes statins (started last visit.). There are no compliance problems.    Lab Results  Component Value Date   CREATININE 0.64 05/08/2021   BUN 13 05/08/2021   NA 139 05/08/2021   K 3.9 05/08/2021   CL 101 05/08/2021   CO2 22 05/08/2021   Lab Results  Component Value Date   CHOL 244 (H) 02/05/2021   HDL 51 02/05/2021   LDLCALC 144 (H) 02/05/2021   TRIG 273 (H) 02/05/2021   CHOLHDL 4.8 (H) 02/05/2021   Lab Results  Component Value Date   TSH 1.180 02/05/2021   Lab Results  Component Value Date   HGBA1C 7.6 (A) 09/13/2021   Lab Results  Component Value Date   WBC 13.4 (H) 02/05/2021   HGB 15.3 02/05/2021   HCT 44.9 02/05/2021   MCV 88 02/05/2021   PLT 304 02/05/2021   Lab Results  Component Value Date   ALT 26 05/08/2021   AST 19 05/08/2021   ALKPHOS 87 05/08/2021   BILITOT <0.2  05/08/2021     Review of Systems  Constitutional:  Negative for appetite change, fatigue, fever and unexpected weight change.  HENT:  Negative for tinnitus and trouble swallowing.   Eyes:  Negative for visual disturbance.  Respiratory:  Negative for cough, chest tightness and shortness of breath.   Cardiovascular:  Negative for chest pain, palpitations and leg swelling.  Gastrointestinal:  Negative for abdominal pain.  Endocrine: Negative for polydipsia and polyuria.  Genitourinary:  Negative for dysuria and hematuria.  Musculoskeletal:  Negative for arthralgias.  Neurological:  Negative for tremors, numbness and headaches.  Psychiatric/Behavioral:  Negative for dysphoric mood.    Patient Active Problem List   Diagnosis Date Noted   Centrilobular emphysema ([place]) 09/13/2021   Pulmonary nodule 1 cm or greater in diameter 09/13/2021   Aortic atherosclerosis ([place]) 07/10/2021   Hyperlipidemia associated with type 2 diabetes mellitus ([place]) 05/08/2021   Type II diabetes mellitus with complication ([place]) [id number]   Tobacco use disorder 02/05/2021    No Known Allergies  Past Surgical History:  Procedure Laterality Date   CATARACT EXTRACTION W/PHACO Left 07/15/2019   Procedure: CATARACT EXTRACTION PHACO AND INTRAOCULAR LENS PLACEMENT ([place]) LEFT;  Surgeon: Eulogio Bear, MD;  Location: [place];  Service: Ophthalmology;  Laterality: Left;   CATARACT EXTRACTION W/PHACO Right 08/08/2019   Procedure: CATARACT EXTRACTION PHACO AND INTRAOCULAR LENS PLACEMENT ([facility]) RIGHT;  Surgeon: Eulogio Bear, MD;  Location: [place];  Service: Ophthalmology;  Laterality: Right;    Social History   Tobacco Use   Smoking status: Every Day    Packs/day: 1.00    Years: 60.00    Pack years: 60.00    Types: Cigarettes   Smokeless tobacco: Never  Vaping Use   Vaping Use: Never used  Substance Use Topics   Alcohol use: Not Currently   Drug use: Never     Medication list  has been reviewed and updated.  Current Meds  Medication Sig   atorvastatin (LIPITOR) 10 MG tablet Take 1 tablet by mouth once daily   buPROPion (WELLBUTRIN SR) 150 MG 12 hr tablet Take 1 tablet (150 mg total) by mouth 2 (two) times daily.   metFORMIN (GLUCOPHAGE-XR) 500 MG 24 hr tablet Take 2 tablets (1,000 mg total) by mouth daily with breakfast.   Multiple Vitamin (MULTIVITAMIN) tablet Take 1 tablet by mouth daily.    PHQ 2/9 Scores 09/13/2021 05/08/2021 02/05/2021  PHQ - 2 Score 0 0 0  PHQ- 9 Score 0 0 1    GAD 7 : Generalized Anxiety Score 09/13/2021 05/08/2021 02/05/2021  Nervous, Anxious, on Edge 0 0 0  Control/stop worrying 0 0 0  Worry too much - different things 0 0 0  Trouble relaxing 0 0 0  Restless 0 0 0  Easily annoyed or irritable 0 0 0  Afraid - awful might happen 0 0 0  Total GAD 7 Score 0 0 0  Anxiety Difficulty - - Not difficult at all    BP Readings from Last 3 Encounters:  09/13/21 118/82  05/08/21 122/70  02/05/21 108/68    Physical Exam Vitals and nursing note reviewed.  Constitutional:      General: She is not in acute distress.    Appearance: She is well-developed.  HENT:     Head: Normocephalic and atraumatic.  Neck:     Vascular: No carotid bruit.  Cardiovascular:     Rate and Rhythm: Normal rate and regular rhythm.     Pulses: Normal pulses.     Heart sounds: No murmur heard. Pulmonary:     Effort: Pulmonary effort is normal. No respiratory distress.     Breath sounds: No wheezing or rhonchi.  Musculoskeletal:     Cervical back: Normal range of motion.     Right lower leg: No edema.     Left lower leg: No edema.  Lymphadenopathy:     Cervical: No cervical adenopathy.  Skin:    General: Skin is warm and dry.     Findings: No rash.  Neurological:     General: No focal deficit present.     Mental Status: She is alert and oriented to person, place, and time.  Psychiatric:        Mood and Affect: Mood normal.        Behavior: Behavior  normal.    Wt Readings from Last 3 Encounters:  09/13/21 162 lb (73.5 kg)  06/19/21 157 lb (71.2 kg)  05/08/21 156 lb (70.8 kg)    BP 118/82   Pulse 89   Temp 98.1 F (36.7 C) (Oral)   Ht '5\' 6"'$  (1.676 m)   Wt 162 lb (73.5 kg)   SpO2 94%   BMI 26.15 kg/m   Assessment and Plan: 1. Type II diabetes mellitus with complication (HCC) Clinically stable by exam and report without s/s of hypoglycemia. DM complicated by hypertension and dyslipidemia. Tolerating medications well without side effects or other concerns. - POCT  HgB A1C = 7.6 down from 7.8 Microalbumin due next visit.  2. Hyperlipidemia associated with type 2 diabetes mellitus ([place]) Now on statin therapy without side effects. - Lipid panel - Comprehensive metabolic panel  3. Tobacco use disorder Cutting back slowly. LDCT scan showed centrilobular emphysema but no suspicious lesions  4. Aortic atherosclerosis ([place]) Now on statin therapy  5. Centrilobular emphysema ([place]) Noted on CT scan Pt encouraged to continue efforts to quit smoking Repeat scan in one year  6. Pulmonary nodule 1 cm or greater in diameter Benign appearance.  Repeat scan one year.   Partially dictated using Editor, commissioning. Any errors are unintentional.  Halina Maidens, MD [facility]  09/13/2021

## 2021-09-14 LAB — COMPREHENSIVE METABOLIC PANEL
ALT: 20 IU/L (ref 0–32)
AST: 16 IU/L (ref 0–40)
Albumin/Globulin Ratio: 1.4 (ref 1.2–2.2)
Albumin: 4.3 g/dL (ref 3.7–4.7)
Alkaline Phosphatase: 112 IU/L (ref 44–121)
BUN/Creatinine Ratio: 14 (ref 12–28)
BUN: 10 mg/dL (ref 8–27)
Bilirubin Total: 0.2 mg/dL (ref 0.0–1.2)
CO2: 22 mmol/L (ref 20–29)
Calcium: 10.3 mg/dL (ref 8.7–10.3)
Chloride: 100 mmol/L (ref 96–106)
Creatinine, Ser: 0.69 mg/dL (ref 0.57–1.00)
Globulin, Total: 3 g/dL (ref 1.5–4.5)
Glucose: 175 mg/dL — ABNORMAL HIGH (ref 65–99)
Potassium: 4.9 mmol/L (ref 3.5–5.2)
Sodium: 138 mmol/L (ref 134–144)
Total Protein: 7.3 g/dL (ref 6.0–8.5)
eGFR: 92 mL/min/{1.73_m2} (ref >59)

## 2021-09-14 LAB — LIPID PANEL
Chol/HDL Ratio: 2.7 ratio (ref 0.0–4.4)
Cholesterol, Total: 168 mg/dL (ref 100–199)
HDL: 63 mg/dL (ref >39)
LDL Chol Calc (NIH): 80 mg/dL (ref 0–99)
Triglycerides: 145 mg/dL (ref 0–149)
VLDL Cholesterol Cal: 25 mg/dL (ref 5–40)

## 2021-12-13 ENCOUNTER — Other Ambulatory Visit: Payer: Self-pay | Admitting: Internal Medicine

## 2021-12-13 DIAGNOSIS — E118 Type 2 diabetes mellitus with unspecified complications: Secondary | ICD-10-CM

## 2021-12-14 NOTE — Telephone Encounter (Signed)
Requested Prescriptions  °Pending Prescriptions Disp Refills  °• metFORMIN (GLUCOPHAGE-XR) 500 MG 24 hr tablet [Pharmacy Med Name: metFORMIN HCl ER 500 MG Oral Tablet Extended Release 24 Hour] 180 tablet 0  °  Sig: TAKE 2 TABLETS BY MOUTH ONCE DAILY WITH BREAKFAST  °  ° Endocrinology:  Diabetes - Biguanides Passed - 12/13/2021  3:58 PM  °  °  Passed - Cr in normal range and within 360 days  °  Creatinine, Ser  °Date Value Ref Range Status  °09/13/2021 0.69 0.57 - 1.00 mg/dL Final  °   °  °  Passed - HBA1C is between 0 and 7.9 and within 180 days  °  Hemoglobin A1C  °Date Value Ref Range Status  °09/13/2021 7.6 (A) 4.0 - 5.6 % Final  ° °Hgb A1c MFr Bld  °Date Value Ref Range Status  °05/08/2021 7.8 (H) 4.8 - 5.6 % Final  °  Comment:  °           Prediabetes: 5.7 - 6.4 °         Diabetes: >6.4 °         Glycemic control for adults with diabetes: <7.0 °  °   °  °  Passed - eGFR in normal range and within 360 days  °  GFR calc Af Amer  °Date Value Ref Range Status  °02/05/2021 107 >59 mL/min/1.73 Final  °  Comment:  °  **In accordance with recommendations from the NKF-ASN Task force,** °  [facility] is in the process of updating its eGFR calculation to the °  2021 CKD-EPI creatinine equation that estimates kidney function °  without a race variable. °  ° °GFR calc non Af Amer  °Date Value Ref Range Status  °02/05/2021 93 >59 mL/min/1.73 Final  ° °eGFR  °Date Value Ref Range Status  °09/13/2021 92 >59 mL/min/1.73 Final  °   °  °  Passed - Valid encounter within last 6 months  °  Recent Outpatient Visits   °      ° 3 months ago Type II diabetes mellitus with complication (HCC)  ° [facility] Berglund, Laura H, MD  ° 7 months ago Type II diabetes mellitus with complication (HCC)  ° [facility] Berglund, Laura H, MD  ° 10 months ago Type II diabetes mellitus with complication (HCC)  ° [facility] Berglund, Laura H, MD  °  °  °Future Appointments   °        ° In 1 month Berglund, Laura H, MD [facility]  °  ° °  °  °  ° ° °

## 2021-12-16 ENCOUNTER — Other Ambulatory Visit: Payer: Self-pay | Admitting: Internal Medicine

## 2021-12-16 DIAGNOSIS — E118 Type 2 diabetes mellitus with unspecified complications: Secondary | ICD-10-CM

## 2021-12-17 NOTE — Telephone Encounter (Signed)
last RF 12/14/21 #180 refused --too soon  Requested Prescriptions  Refused Prescriptions Disp Refills   metFORMIN (GLUCOPHAGE-XR) 500 MG 24 hr tablet [Pharmacy Med Name: metFORMIN HCl ER 500 MG Oral Tablet Extended Release 24 Hour] 180 tablet 0    Sig: TAKE 2 TABLETS BY Sylvia     Endocrinology:  Diabetes - Biguanides Passed - 12/16/2021  9:44 AM      Passed - Cr in normal range and within 360 days    Creatinine, Ser  Date Value Ref Range Status  09/13/2021 0.69 0.57 - 1.00 mg/dL Final         Passed - HBA1C is between 0 and 7.9 and within 180 days    Hemoglobin A1C  Date Value Ref Range Status  09/13/2021 7.6 (A) 4.0 - 5.6 % Final   Hgb A1c MFr Bld  Date Value Ref Range Status  05/08/2021 7.8 (H) 4.8 - 5.6 % Final    Comment:             Prediabetes: 5.7 - 6.4          Diabetes: >6.4          Glycemic control for adults with diabetes: <7.0          Passed - eGFR in normal range and within 360 days    GFR calc Af Amer  Date Value Ref Range Status  02/05/2021 107 >59 mL/min/1.73 Final    Comment:    **In accordance with recommendations from the NKF-ASN Task force,**   [facility] is in the process of updating its eGFR calculation to the   2021 CKD-EPI creatinine equation that estimates kidney function   without a race variable.    GFR calc non Af Amer  Date Value Ref Range Status  02/05/2021 93 >59 mL/min/1.73 Final   eGFR  Date Value Ref Range Status  09/13/2021 92 >59 mL/min/1.73 Final         Passed - Valid encounter within last 6 months    Recent Outpatient Visits          3 months ago Type II diabetes mellitus with complication [facility])   [facility] Glean Hess, MD   7 months ago Type II diabetes mellitus with complication [facility])   [facility] Glean Hess, MD   10 months ago Type II diabetes mellitus with complication [facility])   [facility] Glean Hess, MD      Future Appointments             In 3 weeks Army Melia Jesse Sans, MD [facility]

## 2022-01-07 ENCOUNTER — Telehealth: Payer: Self-pay | Admitting: Internal Medicine

## 2022-01-07 NOTE — Telephone Encounter (Signed)
Copied from [place] [id number]. Topic: Medicare AWV >> Jan 07, 2022 10:51 AM Cher Nakai R wrote: Reason for CRM:  Left message for patient to call back and schedule Medicare Annual Wellness Visit (AWV) in office.   If unable to come into the office for AWV,  please offer to do virtually or by telephone.  No hx of AWV eligible for AWVI as of  02/27/2015 per [employer]  Please schedule at anytime with [facility] Health Advisor.      40 Minutes appointment   Any questions, please call me at [phone]

## 2022-01-13 ENCOUNTER — Other Ambulatory Visit: Payer: Self-pay

## 2022-01-13 ENCOUNTER — Encounter: Payer: Self-pay | Admitting: Internal Medicine

## 2022-01-13 ENCOUNTER — Ambulatory Visit (INDEPENDENT_AMBULATORY_CARE_PROVIDER_SITE_OTHER): Payer: Medicare Other | Admitting: Internal Medicine

## 2022-01-13 VITALS — BP 128/70 | HR 78 | Ht 66.0 in | Wt 164.2 lb

## 2022-01-13 DIAGNOSIS — F17200 Nicotine dependence, unspecified, uncomplicated: Secondary | ICD-10-CM | POA: Diagnosis not present

## 2022-01-13 DIAGNOSIS — E785 Hyperlipidemia, unspecified: Secondary | ICD-10-CM | POA: Diagnosis not present

## 2022-01-13 DIAGNOSIS — R911 Solitary pulmonary nodule: Secondary | ICD-10-CM | POA: Diagnosis not present

## 2022-01-13 DIAGNOSIS — I700 Atherosclerosis of aorta: Secondary | ICD-10-CM | POA: Diagnosis not present

## 2022-01-13 DIAGNOSIS — J432 Centrilobular emphysema: Secondary | ICD-10-CM

## 2022-01-13 DIAGNOSIS — E1169 Type 2 diabetes mellitus with other specified complication: Secondary | ICD-10-CM

## 2022-01-13 DIAGNOSIS — E118 Type 2 diabetes mellitus with unspecified complications: Secondary | ICD-10-CM

## 2022-01-13 MED ORDER — BUPROPION HCL ER (SR) 150 MG PO TB12: 150.0000 mg | ORAL_TABLET | Freq: Two times a day (BID) | ORAL | 1 refills | Status: DC

## 2022-01-13 MED ORDER — ATORVASTATIN CALCIUM 10 MG PO TABS: 10.0000 mg | ORAL_TABLET | Freq: Every day | ORAL | 1 refills | Status: DC

## 2022-01-13 NOTE — Progress Notes (Signed)
Date:  01/13/2022   Name:  Suzanne Richardson   DOB:  07/09/1951   MRN:  [id number]   Chief Complaint: Diabetes (Foot Exam. )  Diabetes She presents for her follow-up diabetic visit. She has type 2 diabetes mellitus. There are no hypoglycemic associated symptoms. Pertinent negatives for hypoglycemia include no headaches or tremors. Pertinent negatives for diabetes include no chest pain, no fatigue, no polydipsia and no polyuria. There are no hypoglycemic complications. Pertinent negatives for diabetic complications include no CVA, heart disease or nephropathy. Current diabetic treatment includes oral agent (monotherapy). She is compliant with treatment all of the time. Her weight is stable. She is following a generally healthy diet. Her breakfast blood glucose is taken between 6-7 am. Her breakfast blood glucose range is generally 180-200 mg/dl. An ACE inhibitor/angiotensin II receptor blocker is not being taken. Eye exam is not current.   Lab Results  Component Value Date   NA 138 09/13/2021   K 4.9 09/13/2021   CO2 22 09/13/2021   GLUCOSE 175 (H) 09/13/2021   BUN 10 09/13/2021   CREATININE 0.69 09/13/2021   CALCIUM 10.3 09/13/2021   EGFR 92 09/13/2021   GFRNONAA 93 02/05/2021   Lab Results  Component Value Date   CHOL 168 09/13/2021   HDL 63 09/13/2021   LDLCALC 80 09/13/2021   TRIG 145 09/13/2021   CHOLHDL 2.7 09/13/2021   Lab Results  Component Value Date   TSH 1.180 02/05/2021   Lab Results  Component Value Date   HGBA1C 7.6 (A) 09/13/2021   Lab Results  Component Value Date   WBC 13.4 (H) 02/05/2021   HGB 15.3 02/05/2021   HCT 44.9 02/05/2021   MCV 88 02/05/2021   PLT 304 02/05/2021   Lab Results  Component Value Date   ALT 20 09/13/2021   AST 16 09/13/2021   ALKPHOS 112 09/13/2021   BILITOT 0.2 09/13/2021   No results found for: 25OHVITD2, 25OHVITD3, VD25OH   Review of Systems  Constitutional:  Negative for appetite change, fatigue, fever and unexpected  weight change.  HENT:  Negative for tinnitus and trouble swallowing.   Eyes:  Negative for visual disturbance.  Respiratory:  Negative for cough, chest tightness and shortness of breath.   Cardiovascular:  Negative for chest pain, palpitations and leg swelling.  Gastrointestinal:  Negative for abdominal pain.  Endocrine: Negative for polydipsia and polyuria.  Genitourinary:  Negative for dysuria and hematuria.  Musculoskeletal:  Negative for arthralgias.  Neurological:  Negative for tremors, numbness and headaches.  Psychiatric/Behavioral:  Negative for dysphoric mood.    Patient Active Problem List   Diagnosis Date Noted   Centrilobular emphysema ([place]) 09/13/2021   Pulmonary nodule 1 cm or greater in diameter 09/13/2021   Aortic atherosclerosis ([place]) 07/10/2021   Hyperlipidemia associated with type 2 diabetes mellitus ([place]) 05/08/2021   Type II diabetes mellitus with complication ([place]) 92/42/6834   Tobacco use disorder 02/05/2021    No Known Allergies  Past Surgical History:  Procedure Laterality Date   CATARACT EXTRACTION W/PHACO Left 07/15/2019   Procedure: CATARACT EXTRACTION PHACO AND INTRAOCULAR LENS PLACEMENT ([place]) LEFT;  Surgeon: Eulogio Bear, MD;  Location: [place];  Service: Ophthalmology;  Laterality: Left;   CATARACT EXTRACTION W/PHACO Right 08/08/2019   Procedure: CATARACT EXTRACTION PHACO AND INTRAOCULAR LENS PLACEMENT ([facility]) RIGHT;  Surgeon: Eulogio Bear, MD;  Location: [place];  Service: Ophthalmology;  Laterality: Right;    Social History   Tobacco Use   Smoking  status: Every Day    Packs/day: 1.00    Years: 60.00    Pack years: 60.00    Types: Cigarettes   Smokeless tobacco: Never  Vaping Use   Vaping Use: Never used  Substance Use Topics   Alcohol use: Not Currently   Drug use: Never     Medication list has been reviewed and updated.  Current Meds  Medication Sig   atorvastatin (LIPITOR) 10 MG tablet Take 1  tablet by mouth once daily   buPROPion (WELLBUTRIN SR) 150 MG 12 hr tablet Take 1 tablet (150 mg total) by mouth 2 (two) times daily.   metFORMIN (GLUCOPHAGE-XR) 500 MG 24 hr tablet TAKE 2 TABLETS BY MOUTH ONCE DAILY WITH BREAKFAST   Multiple Vitamin (MULTIVITAMIN) tablet Take 1 tablet by mouth daily.    PHQ 2/9 Scores 01/13/2022 09/13/2021 05/08/2021 02/05/2021  PHQ - 2 Score 0 0 0 0  PHQ- 9 Score 0 0 0 1    GAD 7 : Generalized Anxiety Score 01/13/2022 09/13/2021 05/08/2021 02/05/2021  Nervous, Anxious, on Edge 0 0 0 0  Control/stop worrying 0 0 0 0  Worry too much - different things 0 0 0 0  Trouble relaxing 0 0 0 0  Restless 0 0 0 0  Easily annoyed or irritable 0 0 0 0  Afraid - awful might happen 0 0 0 0  Total GAD 7 Score 0 0 0 0  Anxiety Difficulty Not difficult at all - - Not difficult at all    BP Readings from Last 3 Encounters:  01/13/22 128/70  09/13/21 118/82  05/08/21 122/70    Physical Exam Vitals and nursing note reviewed.  Constitutional:      General: She is not in acute distress.    Appearance: She is well-developed.  HENT:     Head: Normocephalic and atraumatic.  Cardiovascular:     Rate and Rhythm: Normal rate and regular rhythm.     Pulses: Normal pulses.     Heart sounds: No murmur heard. Pulmonary:     Effort: Pulmonary effort is normal. No respiratory distress.     Breath sounds: No wheezing or rhonchi.  Musculoskeletal:     Cervical back: Normal range of motion.     Right lower leg: No edema.     Left lower leg: No edema.  Lymphadenopathy:     Cervical: No cervical adenopathy.  Skin:    General: Skin is warm and dry.     Findings: No rash.  Neurological:     Mental Status: She is alert and oriented to person, place, and time.  Psychiatric:        Mood and Affect: Mood normal.        Behavior: Behavior normal.    Wt Readings from Last 3 Encounters:  01/13/22 164 lb 3.2 oz (74.5 kg)  09/13/21 162 lb (73.5 kg)  06/19/21 157 lb (71.2 kg)     BP 128/70    Pulse 78    Ht _0  (1.676 m)    Wt 164 lb 3.2 oz (74.5 kg)    SpO2 95%    BMI 26.50 kg/m   Assessment and Plan: 1. Type II diabetes mellitus with complication (HCC) BS have been controlled but readings at home are higher recently. No change in medication or diet.  Weight stable. Will check labs and consider increasing metformin to 3 per day. She is reminded to schedule eye exam. - Basic metabolic panel - Hemoglobin A1c - Microalbumin /  creatinine urine ratio  2. Hyperlipidemia associated with type 2 diabetes mellitus ([place]) Tolerating statin medication without side effects at this time LDL is at goal of < 70 on current dose Continue same therapy without change at this time. - atorvastatin (LIPITOR) 10 MG tablet; Take 1 tablet (10 mg total) by mouth daily.  Dispense: 90 tablet; Refill: 1  3. Pulmonary nodule 1 cm or greater in diameter Will order follow up CT at next visit.  4. Centrilobular emphysema ([place]) With mild SOB on exertion and chronic cough. Pt denies limitations to activities - she is sedentary.  5. Aortic atherosclerosis ([place]) On statin therapy  6. Tobacco use disorder Cutting back tobacco use slowly. Bupropion is helpful.  No side effects noted. - buPROPion (WELLBUTRIN SR) 150 MG 12 hr tablet; Take 1 tablet (150 mg total) by mouth 2 (two) times daily.  Dispense: 180 tablet; Refill: 1   Partially dictated using Editor, commissioning. Any errors are unintentional.  Halina Maidens, MD [facility]  01/13/2022

## 2022-01-14 ENCOUNTER — Other Ambulatory Visit: Payer: Self-pay | Admitting: Internal Medicine

## 2022-01-14 DIAGNOSIS — E118 Type 2 diabetes mellitus with unspecified complications: Secondary | ICD-10-CM

## 2022-01-14 LAB — BASIC METABOLIC PANEL
BUN/Creatinine Ratio: 13 (ref 12–28)
BUN: 9 mg/dL (ref 8–27)
CO2: 22 mmol/L (ref 20–29)
Calcium: 9.7 mg/dL (ref 8.7–10.3)
Chloride: 103 mmol/L (ref 96–106)
Creatinine, Ser: 0.71 mg/dL (ref 0.57–1.00)
Glucose: 256 mg/dL — ABNORMAL HIGH (ref 70–99)
Potassium: 4.6 mmol/L (ref 3.5–5.2)
Sodium: 140 mmol/L (ref 134–144)
eGFR: 90 mL/min/{1.73_m2} (ref >59)

## 2022-01-14 LAB — HEMOGLOBIN A1C
Est. average glucose Bld gHb Est-mCnc: 232 mg/dL
Hgb A1c MFr Bld: 9.7 % — ABNORMAL HIGH (ref 4.8–5.6)

## 2022-01-14 LAB — MICROALBUMIN / CREATININE URINE RATIO
Creatinine, Urine: 56.4 mg/dL
Microalb/Creat Ratio: 7 mg/g creat (ref 0–29)
Microalbumin, Urine: 4.1 ug/mL

## 2022-01-14 MED ORDER — METFORMIN HCL ER 500 MG PO TB24: ORAL_TABLET | ORAL | 1 refills | Status: DC

## 2022-02-22 ENCOUNTER — Other Ambulatory Visit: Payer: Self-pay | Admitting: Internal Medicine

## 2022-02-22 DIAGNOSIS — E118 Type 2 diabetes mellitus with unspecified complications: Secondary | ICD-10-CM

## 2022-02-24 NOTE — Telephone Encounter (Signed)
Dose/signature has changed and has current rx.with correct dose/instructions. Requested Prescriptions  Pending Prescriptions Disp Refills   metFORMIN (GLUCOPHAGE-XR) 500 MG 24 hr tablet [Pharmacy Med Name: metFORMIN HCl ER 500 MG Oral Tablet Extended Release 24 Hour] 180 tablet 0    Sig: TAKE 2 TABLETS BY [place]     Endocrinology:  Diabetes - Biguanides Failed - 02/22/2022  9:53 AM      Failed - HBA1C is between 0 and 7.9 and within 180 days    Hgb A1c MFr Bld  Date Value Ref Range Status  01/13/2022 9.7 (H) 4.8 - 5.6 % Final    Comment:             Prediabetes: 5.7 - 6.4          Diabetes: >6.4          Glycemic control for adults with diabetes: <7.0          Failed - B12 Level in normal range and within 720 days    No results found for: VITAMINB12       Failed - CBC within normal limits and completed in the last 12 months    WBC  Date Value Ref Range Status  02/05/2021 13.4 (H) 3.4 - 10.8 x10E3/uL Final   RBC  Date Value Ref Range Status  02/05/2021 5.08 3.77 - 5.28 x10E6/uL Final   Hemoglobin  Date Value Ref Range Status  02/05/2021 15.3 11.1 - 15.9 g/dL Final   Hematocrit  Date Value Ref Range Status  02/05/2021 44.9 34.0 - 46.6 % Final   MCHC  Date Value Ref Range Status  02/05/2021 34.1 31.5 - 35.7 g/dL Final   [facility]  Date Value Ref Range Status  02/05/2021 30.1 26.6 - 33.0 pg Final   MCV  Date Value Ref Range Status  02/05/2021 88 79 - 97 fL Final   No results found for: PLTCOUNTKUC, LABPLAT, POCPLA RDW  Date Value Ref Range Status  02/05/2021 12.2 11.7 - 15.4 % Final         Passed - Cr in normal range and within 360 days    Creatinine, Ser  Date Value Ref Range Status  01/13/2022 0.71 0.57 - 1.00 mg/dL Final         Passed - eGFR in normal range and within 360 days    GFR calc Af Amer  Date Value Ref Range Status  02/05/2021 107 >59 mL/min/1.73 Final    Comment:    **In accordance with recommendations from the  NKF-ASN Task force,**   [facility] is in the process of updating its eGFR calculation to the   2021 CKD-EPI creatinine equation that estimates kidney function   without a race variable.    GFR calc non Af Amer  Date Value Ref Range Status  02/05/2021 93 >59 mL/min/1.73 Final   eGFR  Date Value Ref Range Status  01/13/2022 90 >59 mL/min/1.73 Final         Passed - Valid encounter within last 6 months    Recent Outpatient Visits          1 month ago Type II diabetes mellitus with complication [facility])   [facility] Glean Hess, MD   5 months ago Type II diabetes mellitus with complication [facility])   [facility] Glean Hess, MD   9 months ago Type II diabetes mellitus with complication [facility])   [facility] Glean Hess, MD   1 year  ago Type II diabetes mellitus with complication [facility])   [facility] Glean Hess, MD      Future Appointments            In 2 months [facility] Melia Jesse Sans, MD [facility], [facility]

## 2022-04-23 ENCOUNTER — Encounter: Payer: Self-pay | Admitting: Internal Medicine

## 2022-04-23 DIAGNOSIS — E119 Type 2 diabetes mellitus without complications: Secondary | ICD-10-CM | POA: Diagnosis not present

## 2022-04-23 LAB — HM DIABETES EYE EXAM

## 2022-05-05 ENCOUNTER — Ambulatory Visit (INDEPENDENT_AMBULATORY_CARE_PROVIDER_SITE_OTHER): Payer: Medicare Other

## 2022-05-05 DIAGNOSIS — Z122 Encounter for screening for malignant neoplasm of respiratory organs: Secondary | ICD-10-CM | POA: Diagnosis not present

## 2022-05-05 DIAGNOSIS — Z0000 Encounter for general adult medical examination without abnormal findings: Secondary | ICD-10-CM | POA: Diagnosis not present

## 2022-05-05 NOTE — Patient Instructions (Signed)
Ms. Hantz , ?Thank you for taking time to come for your Medicare Wellness Visit. I appreciate your ongoing commitment to your health goals. Please review the following plan we discussed and let me know if I can assist you in the future.  ? ?Screening recommendations/referrals: ?Colonoscopy: declined ?Mammogram: declined ?Bone Density: declined ?Recommended yearly ophthalmology/optometry visit for glaucoma screening and checkup ?Recommended yearly dental visit for hygiene and checkup ? ?Vaccinations: ?Influenza vaccine: declined ?Pneumococcal vaccine: declined ?Tdap vaccine: declined ?Shingles vaccine: declined   ?Covid-19:declined ? ?Advanced directives: Advance directive discussed with you today. I have provided a copy for you to complete at home and have notarized. Once this is complete please bring a copy in to our office so we can scan it into your chart.  ? ?Conditions/risks identified: Recommend increasing water intake and physical activity.  ? ?Please utilize the following resources for diabetes management: ? ?Www.diabetes.org ? ?Www.cornerstones4care.com ? ?Next appointment: Follow up in one year for your annual wellness visit  ? ? ?Preventive Care 73 Years and Older, Female ?Preventive care refers to lifestyle choices and visits with your health care provider that can promote health and wellness. ?What does preventive care include? ?A yearly physical exam. This is also called an annual well check. ?Dental exams once or twice a year. ?Routine eye exams. Ask your health care provider how often you should have your eyes checked. ?Personal lifestyle choices, including: ?Daily care of your teeth and gums. ?Regular physical activity. ?Eating a healthy diet. ?Avoiding tobacco and drug use. ?Limiting alcohol use. ?Practicing safe sex. ?Taking low-dose aspirin every day. ?Taking vitamin and mineral supplements as recommended by your health care provider. ?What happens during an annual well check? ?The services and  screenings done by your health care provider during your annual well check will depend on your age, overall health, lifestyle risk factors, and family history of disease. ?Counseling  ?Your health care provider may ask you questions about your: ?Alcohol use. ?Tobacco use. ?Drug use. ?Emotional well-being. ?Home and relationship well-being. ?Sexual activity. ?Eating habits. ?History of falls. ?Memory and ability to understand (cognition). ?Work and work Statistician. ?Reproductive health. ?Screening  ?You may have the following tests or measurements: ?Height, weight, and BMI. ?Blood pressure. ?Lipid and cholesterol levels. These may be checked every 5 years, or more frequently if you are over 56 years old. ?Skin check. ?Lung cancer screening. You may have this screening every year starting at age 64 if you have a 30-pack-year history of smoking and currently smoke or have quit within the past 15 years. ?Fecal occult blood test (FOBT) of the stool. You may have this test every year starting at age 67. ?Flexible sigmoidoscopy or colonoscopy. You may have a sigmoidoscopy every 5 years or a colonoscopy every 10 years starting at age 32. ?Hepatitis C blood test. ?Hepatitis B blood test. ?Sexually transmitted disease (STD) testing. ?Diabetes screening. This is done by checking your blood sugar (glucose) after you have not eaten for a while (fasting). You may have this done every 1-3 years. ?Bone density scan. This is done to screen for osteoporosis. You may have this done starting at age 58. ?Mammogram. This may be done every 1-2 years. Talk to your health care provider about how often you should have regular mammograms. ?Talk with your health care provider about your test results, treatment options, and if necessary, the need for more tests. ?Vaccines  ?Your health care provider may recommend certain vaccines, such as: ?Influenza vaccine. This is recommended every year. ?Tetanus,  diphtheria, and acellular pertussis (Tdap,  Td) vaccine. You may need a Td booster every 10 years. ?Zoster vaccine. You may need this after age 44. ?Pneumococcal 13-valent conjugate (PCV13) vaccine. One dose is recommended after age 88. ?Pneumococcal polysaccharide (PPSV23) vaccine. One dose is recommended after age 46. ?Talk to your health care provider about which screenings and vaccines you need and how often you need them. ?This information is not intended to replace advice given to you by your health care provider. Make sure you discuss any questions you have with your health care provider. ?Document Released: 01/11/2016 Document Revised: 09/03/2016 Document Reviewed: 10/16/2015 ?Elsevier Interactive Patient Education ? 2017 [employer]. ? ?Fall Prevention in the Home ?Falls can cause injuries. They can happen to people of all ages. There are many things you can do to make your home safe and to help prevent falls. ?What can I do on the outside of my home? ?Regularly fix the edges of walkways and driveways and fix any cracks. ?Remove anything that might make you trip as you walk through a door, such as a raised step or threshold. ?Trim any bushes or trees on the path to your home. ?Use bright outdoor lighting. ?Clear any walking paths of anything that might make someone trip, such as rocks or tools. ?Regularly check to see if handrails are loose or broken. Make sure that both sides of any steps have handrails. ?Any raised decks and porches should have guardrails on the edges. ?Have any leaves, snow, or ice cleared regularly. ?Use sand or salt on walking paths during winter. ?Clean up any spills in your garage right away. This includes oil or grease spills. ?What can I do in the bathroom? ?Use night lights. ?Install grab bars by the toilet and in the tub and shower. Do not use towel bars as grab bars. ?Use non-skid mats or decals in the tub or shower. ?If you need to sit down in the shower, use a plastic, non-slip stool. ?Keep the floor dry. Clean up any  water that spills on the floor as soon as it happens. ?Remove soap buildup in the tub or shower regularly. ?Attach bath mats securely with double-sided non-slip rug tape. ?Do not have throw rugs and other things on the floor that can make you trip. ?What can I do in the bedroom? ?Use night lights. ?Make sure that you have a light by your bed that is easy to reach. ?Do not use any sheets or blankets that are too big for your bed. They should not hang down onto the floor. ?Have a firm chair that has side arms. You can use this for support while you get dressed. ?Do not have throw rugs and other things on the floor that can make you trip. ?What can I do in the kitchen? ?Clean up any spills right away. ?Avoid walking on wet floors. ?Keep items that you use a lot in easy-to-reach places. ?If you need to reach something above you, use a strong step stool that has a grab bar. ?Keep electrical cords out of the way. ?Do not use floor polish or wax that makes floors slippery. If you must use wax, use non-skid floor wax. ?Do not have throw rugs and other things on the floor that can make you trip. ?What can I do with my stairs? ?Do not leave any items on the stairs. ?Make sure that there are handrails on both sides of the stairs and use them. Fix handrails that are broken or loose. Make  sure that handrails are as long as the stairways. ?Check any carpeting to make sure that it is firmly attached to the stairs. Fix any carpet that is loose or worn. ?Avoid having throw rugs at the top or bottom of the stairs. If you do have throw rugs, attach them to the floor with carpet tape. ?Make sure that you have a light switch at the top of the stairs and the bottom of the stairs. If you do not have them, ask someone to add them for you. ?What else can I do to help prevent falls? ?Wear shoes that: ?Do not have high heels. ?Have rubber bottoms. ?Are comfortable and fit you well. ?Are closed at the toe. Do not wear sandals. ?If you use a  stepladder: ?Make sure that it is fully opened. Do not climb a closed stepladder. ?Make sure that both sides of the stepladder are locked into place. ?Ask someone to hold it for you, if possible. ?[place]

## 2022-05-05 NOTE — Progress Notes (Signed)
? ?Subjective:  ? Suzanne Richardson is a 71 y.o. female who presents for an Initial Medicare Annual Wellness Visit. ? ?Virtual Visit via Telephone Note ? ?I connected with  Bary Castilla on 05/05/22 at 10:15 AM EDT by telephone and verified that I am speaking with the correct person using two identifiers. ? ?Location: ?Patient: home ?Provider: [facility] ?Persons participating in the virtual visit: patient/Nurse Health Advisor ?  ?I discussed the limitations, risks, security and privacy concerns of performing an evaluation and management service by telephone and the availability of in person appointments. The patient expressed understanding and agreed to proceed. ? ?Interactive audio and video telecommunications were attempted between this nurse and patient, however failed, due to patient having technical difficulties OR patient did not have access to video capability.  We continued and completed visit with audio only. ? ?Some vital signs may be absent or patient reported.  ? ?Clemetine Marker, LPN ? ? ?Review of Systems    ? ?Cardiac Risk Factors include: advanced age (>67mn, >>69women);diabetes mellitus;dyslipidemia;smoking/ tobacco exposure ? ?   ?Objective:  ?  ?There were no vitals filed for this visit. ?There is no height or weight on file to calculate BMI. ? ? ?  05/05/2022  ? 10:21 AM 08/08/2019  ? 10:32 AM 07/15/2019  ?  9:40 AM  ?Advanced Directives  ?Does Patient Have a Medical Advance Directive? No No No  ?Would patient like information on creating a medical advance directive? Yes (MAU/Ambulatory/Procedural Areas - Information given) No - Patient declined No - Patient declined  ? ? ?Current Medications (verified) ?Outpatient Encounter Medications as of 05/05/2022  ?Medication Sig  ? atorvastatin (LIPITOR) 10 MG tablet Take 1 tablet (10 mg total) by mouth daily.  ? buPROPion (WELLBUTRIN SR) 150 MG 12 hr tablet Take 1 tablet (150 mg total) by mouth 2 (two) times daily.  ? metFORMIN (GLUCOPHAGE-XR) 500 MG 24 hr tablet Take 2  tablets (1,000 mg total) by mouth daily with breakfast AND 1 tablet (500 mg total) daily after supper.  ? Multiple Vitamin (MULTIVITAMIN) tablet Take 1 tablet by mouth daily.  ? ?No facility-administered encounter medications on file as of 05/05/2022.  ? ? ?Allergies (verified) ?Patient has no known allergies.  ? ?History: ?Past Medical History:  ?Diagnosis Date  ? Depression   ? Hyperlipidemia   ? Hypertension   ? ?Past Surgical History:  ?Procedure Laterality Date  ? CATARACT EXTRACTION W/PHACO Left 07/15/2019  ? Procedure: CATARACT EXTRACTION PHACO AND INTRAOCULAR LENS PLACEMENT ([place];  Surgeon: KEulogio Bear MD;  [place]  Service: Ophthalmology;  Laterality: Left;  ? CATARACT EXTRACTION W/PHACO Right 08/08/2019  ? Procedure: CATARACT EXTRACTION PHACO AND INTRAOCULAR LENS PLACEMENT ([place] RIGHT;  Surgeon: KEulogio Bear MD;  [place]  Service: Ophthalmology;  Laterality: Right;  ? ?Family History  ?Problem Relation Age of Onset  ? Parkinson's disease Mother   ? Lung cancer Father   ? ?Social History  ? ?Socioeconomic History  ? Marital status: Married  ?  Spouse name: TMasaye Gatchalian ? Number of children: 2  ? Years of education: Not on file  ? Highest education level: Not on file  ?Occupational History  ? Not on file  ?Tobacco Use  ? Smoking status: Every Day  ?  Packs/day: 1.00  ?  Years: 60.00  ?  Pack years: 60.00  ?  Types: Cigarettes  ? Smokeless tobacco: Never  ?Vaping Use  ? Vaping Use: Never used  ?  Substance and Sexual Activity  ? Alcohol use: Not Currently  ? Drug use: Never  ? Sexual activity: Yes  ?  Birth control/protection: None  ?Other Topics Concern  ? Not on file  ?Social History Narrative  ? Not on file  ? ?Social Determinants of Health  ? ?Financial Resource Strain: Low Risk   ? Difficulty of Paying Living Expenses: Not hard at all  ?Food Insecurity: No Food Insecurity  ? Worried About Charity fundraiser in the Last Year: Never true  ?  Ran Out of Food in the Last Year: Never true  ?Transportation Needs: No Transportation Needs  ? Lack of Transportation (Medical): No  ? Lack of Transportation (Non-Medical): No  ?Physical Activity: Inactive  ? Days of Exercise per Week: 0 days  ? Minutes of Exercise per Session: 0 min  ?Stress: No Stress Concern Present  ? Feeling of Stress : Not at all  ?Social Connections: Moderately Isolated  ? Frequency of Communication with Friends and Family: More than three times a week  ? Frequency of Social Gatherings with Friends and Family: Once a week  ? Attends Religious Services: Never  ? Active Member of Clubs or Organizations: No  ? Attends Archivist Meetings: Never  ? Marital Status: Married  ? ? ?Tobacco Counseling ?Ready to quit: Yes ?Counseling given: Yes ? ? ?Clinical Intake: ? ?Pre-visit preparation completed: Yes ? ?Pain : No/denies pain ? ?  ? ?Nutritional Risks: None ?Diabetes: Yes ?CBG done?: No ?Did pt. bring in CBG monitor from home?: No ? ?How often do you need to have someone help you when you read instructions, pamphlets, or other written materials from your doctor or pharmacy?: 1 - Never ? ?Nutrition Risk Assessment: ? ?Has the patient had any N/V/D within the last 2 months?  No  ?Does the patient have any non-healing wounds?  No  ?Has the patient had any unintentional weight loss or weight gain?  No  ? ?Diabetes: ? ?Is the patient diabetic?  Yes  ?If diabetic, was a CBG obtained today?  No  ?Did the patient bring in their glucometer from home?  No  ?How often do you monitor your CBG's? 2-3 times per day.  ? ?Financial Strains and Diabetes Management: ? ?Are you having any financial strains with the device, your supplies or your medication? No .  ?Does the patient want to be seen by Chronic Care Management for management of their diabetes?  No  ?Would the patient like to be referred to a Nutritionist or for Diabetic Management?  No  ? ?Diabetic Exams: ? ?Diabetic Eye Exam: Completed  04/23/22.  ? ?Diabetic Foot Exam: Completed 01/13/22.  ? ?Interpreter Needed?: No ? ?Information entered by :: Clemetine Marker LPN ? ? ?Activities of Daily Living ? ?  05/05/2022  ? 10:22 AM 01/13/2022  ?  8:12 AM  ?In your present state of health, do you have any difficulty performing the following activities:  ?Hearing? 0 0  ?Vision? 0 0  ?Difficulty concentrating or making decisions? 0 0  ?Walking or climbing stairs? 0 0  ?Dressing or bathing? 0 0  ?Doing errands, shopping? 0 0  ?Preparing Food and eating ? N   ?Using the Toilet? N   ?In the past six months, have you accidently leaked urine? N   ?Do you have problems with loss of bowel control? N   ?Managing your Medications? N   ?Managing your Finances? N   ?Housekeeping or managing your Housekeeping?  N   ? ? ?Patient Care Team: ?Glean Hess, MD as PCP - General (Internal Medicine) ? ?Indicate any recent Medical Services you may have received from other than [facility] providers in the past year (date may be approximate). ? ?   ?Assessment:  ? This is a routine wellness examination for [place]. ? ?Hearing/Vision screen ?Hearing Screening - Comments:: Pt denies hearing difficulty ?Vision Screening - Comments:: Annual vision screenings [facility] ? ?Dietary issues and exercise activities discussed: ?Current Exercise Habits: The patient does not participate in regular exercise at present, Exercise limited by: None identified ? ? Goals Addressed   ? ?  ?  ?  ?  ? This Visit's Progress  ?  Patient Stated     ?  Patient would like to lower blood sugar to goal of A1c < 7.0% ?  ? ?  ? ?Depression Screen ? ?  05/05/2022  ? 10:20 AM 01/13/2022  ?  8:12 AM 09/13/2021  ?  9:09 AM 05/08/2021  ?  9:03 AM 02/05/2021  ?  1:58 PM  ?PHQ 2/9 Scores  ?PHQ - 2 Score 0 0 0 0 0  ?PHQ- 9 Score  0 0 0 1  ?  ?Fall Risk ? ?  05/05/2022  ? 10:22 AM 01/13/2022  ?  8:12 AM 09/13/2021  ?  9:10 AM 05/08/2021  ?  9:03 AM 02/05/2021  ?  1:58 PM  ?Fall Risk   ?Falls in the past year? 0 0 0 0 0   ?Number falls in past yr: 0 0 0  0  ?Injury with Fall? 0 0 0  0  ?Risk for fall due to : No Fall Risks No Fall Risks No Fall Risks    ?Follow up Falls prevention discussed Falls evaluation completed Falls evaluatio

## 2022-05-15 ENCOUNTER — Ambulatory Visit (INDEPENDENT_AMBULATORY_CARE_PROVIDER_SITE_OTHER): Payer: Medicare Other | Admitting: Internal Medicine

## 2022-05-15 ENCOUNTER — Encounter: Payer: Self-pay | Admitting: Internal Medicine

## 2022-05-15 VITALS — BP 128/70 | HR 85 | Ht 66.0 in | Wt 159.0 lb

## 2022-05-15 DIAGNOSIS — E785 Hyperlipidemia, unspecified: Secondary | ICD-10-CM

## 2022-05-15 DIAGNOSIS — Z1159 Encounter for screening for other viral diseases: Secondary | ICD-10-CM

## 2022-05-15 DIAGNOSIS — J432 Centrilobular emphysema: Secondary | ICD-10-CM | POA: Diagnosis not present

## 2022-05-15 DIAGNOSIS — E118 Type 2 diabetes mellitus with unspecified complications: Secondary | ICD-10-CM

## 2022-05-15 DIAGNOSIS — E1169 Type 2 diabetes mellitus with other specified complication: Secondary | ICD-10-CM

## 2022-05-15 MED ORDER — UMECLIDINIUM-VILANTEROL 62.5-25 MCG/ACT IN AEPB: 1.0000 | INHALATION_SPRAY | Freq: Every day | RESPIRATORY_TRACT | 0 refills | Status: DC

## 2022-05-15 MED ORDER — ATORVASTATIN CALCIUM 10 MG PO TABS: 10.0000 mg | ORAL_TABLET | Freq: Every day | ORAL | 1 refills | Status: DC

## 2022-05-15 NOTE — Progress Notes (Signed)
Date:  05/15/2022   Name:  Suzanne Richardson   DOB:  Jul 02, 1951   MRN:  [id number]   Chief Complaint: Diabetes  Diabetes She presents for her follow-up diabetic visit. She has type 2 diabetes mellitus. Her disease course has been worsening (last visit). Pertinent negatives for hypoglycemia include no headaches or tremors. Pertinent negatives for diabetes include no chest pain, no fatigue, no polydipsia and no polyuria. Current diabetic treatment includes oral agent (monotherapy) (metformin increased to 3 per day). Her weight is stable. Her breakfast blood glucose is taken between 6-7 am. Her breakfast blood glucose range is generally 180-200 mg/dl. Her bedtime blood glucose is taken between 8-9 pm. Her bedtime blood glucose range is generally 180-200 mg/dl. An ACE inhibitor/angiotensin II receptor blocker is not being taken.  COPD - noted on CT screening.  She admits to some shortness of breath with exertion and after doing her housework, she does not have energy for exercise.  She has never tried an inhaler.  She does continue to smoke and admits she is not taking Bupropion regularly.  Lab Results  Component Value Date   NA 140 01/13/2022   K 4.6 01/13/2022   CO2 22 01/13/2022   GLUCOSE 256 (H) 01/13/2022   BUN 9 01/13/2022   CREATININE 0.71 01/13/2022   CALCIUM 9.7 01/13/2022   EGFR 90 01/13/2022   GFRNONAA 93 02/05/2021   Lab Results  Component Value Date   CHOL 168 09/13/2021   HDL 63 09/13/2021   LDLCALC 80 09/13/2021   TRIG 145 09/13/2021   CHOLHDL 2.7 09/13/2021   Lab Results  Component Value Date   TSH 1.180 02/05/2021   Lab Results  Component Value Date   HGBA1C 9.7 (H) 01/13/2022   Lab Results  Component Value Date   WBC 13.4 (H) 02/05/2021   HGB 15.3 02/05/2021   HCT 44.9 02/05/2021   MCV 88 02/05/2021   PLT 304 02/05/2021   Lab Results  Component Value Date   ALT 20 09/13/2021   AST 16 09/13/2021   ALKPHOS 112 09/13/2021   BILITOT 0.2 09/13/2021   No  results found for: 25OHVITD2, 25OHVITD3, VD25OH   Review of Systems  Constitutional:  Negative for appetite change, fatigue, fever and unexpected weight change.  HENT:  Negative for tinnitus and trouble swallowing.   Eyes:  Negative for visual disturbance.  Respiratory:  Positive for cough and shortness of breath. Negative for chest tightness and wheezing.   Cardiovascular:  Negative for chest pain, palpitations and leg swelling.  Gastrointestinal:  Negative for abdominal pain.  Endocrine: Negative for polydipsia and polyuria.  Genitourinary:  Negative for dysuria and hematuria.  Musculoskeletal:  Negative for arthralgias.  Neurological:  Negative for tremors, numbness and headaches.  Psychiatric/Behavioral:  Negative for dysphoric mood.    Patient Active Problem List   Diagnosis Date Noted   Centrilobular emphysema ([place]) 09/13/2021   Pulmonary nodule 1 cm or greater in diameter 09/13/2021   Aortic atherosclerosis ([place]) 07/10/2021   Hyperlipidemia associated with type 2 diabetes mellitus ([place]) 05/08/2021   Type II diabetes mellitus with complication ([place]) 94/76/5465   Tobacco use disorder 02/05/2021    No Known Allergies  Past Surgical History:  Procedure Laterality Date   CATARACT EXTRACTION W/PHACO Left 07/15/2019   Procedure: CATARACT EXTRACTION PHACO AND INTRAOCULAR LENS PLACEMENT ([place]) LEFT;  Surgeon: Eulogio Bear, MD;  Location: [place];  Service: Ophthalmology;  Laterality: Left;   CATARACT EXTRACTION W/PHACO Right 08/08/2019   Procedure:  CATARACT EXTRACTION PHACO AND INTRAOCULAR LENS PLACEMENT ([facility]) RIGHT;  Surgeon: Eulogio Bear, MD;  Location: [place];  Service: Ophthalmology;  Laterality: Right;    Social History   Tobacco Use   Smoking status: Every Day    Packs/day: 1.00    Years: 60.00    Pack years: 60.00    Types: Cigarettes   Smokeless tobacco: Never  Vaping Use   Vaping Use: Never used  Substance Use Topics   Alcohol  use: Not Currently   Drug use: Never     Medication list has been reviewed and updated.  Current Meds  Medication Sig   atorvastatin (LIPITOR) 10 MG tablet Take 1 tablet (10 mg total) by mouth daily.   buPROPion (WELLBUTRIN SR) 150 MG 12 hr tablet Take 1 tablet (150 mg total) by mouth 2 (two) times daily.   metFORMIN (GLUCOPHAGE-XR) 500 MG 24 hr tablet Take 2 tablets (1,000 mg total) by mouth daily with breakfast AND 1 tablet (500 mg total) daily after supper.   Multiple Vitamin (MULTIVITAMIN) tablet Take 1 tablet by mouth daily.       05/15/2022    9:17 AM 05/15/2022    9:14 AM 01/13/2022    8:12 AM 09/13/2021    9:09 AM  GAD 7 : Generalized Anxiety Score  Nervous, Anxious, on Edge 0 0 0 0  Control/stop worrying 0 0 0 0  Worry too much - different things 0 0 0 0  Trouble relaxing 0 0 0 0  Restless 0 0 0 0  Easily annoyed or irritable 0 0 0 0  Afraid - awful might happen 0 0 0 0  Total GAD 7 Score 0 0 0 0  Anxiety Difficulty Not difficult at all Not difficult at all Not difficult at all        05/15/2022    9:17 AM  Depression screen PHQ 2/9  Decreased Interest 0  Down, Depressed, Hopeless 0  PHQ - 2 Score 0  Altered sleeping 0  Tired, decreased energy 1  Change in appetite 1  Feeling bad or failure about yourself  0  Trouble concentrating 0  Moving slowly or fidgety/restless 0  Suicidal thoughts 0  PHQ-9 Score 2  Difficult doing work/chores Not difficult at all    BP Readings from Last 3 Encounters:  05/15/22 128/70  01/13/22 128/70  09/13/21 118/82    Physical Exam Vitals and nursing note reviewed.  Constitutional:      General: She is not in acute distress.    Appearance: Normal appearance. She is well-developed.  HENT:     Head: Normocephalic and atraumatic.  Cardiovascular:     Rate and Rhythm: Normal rate and regular rhythm.     Pulses: Normal pulses.     Heart sounds: No murmur heard. Pulmonary:     Effort: Pulmonary effort is normal. No  respiratory distress.     Breath sounds: Decreased air movement present. Decreased breath sounds present. No wheezing or rhonchi.  Musculoskeletal:     Cervical back: Normal range of motion.     Right lower leg: No edema.     Left lower leg: No edema.  Lymphadenopathy:     Cervical: No cervical adenopathy.  Skin:    General: Skin is warm and dry.     Findings: No rash.  Neurological:     Mental Status: She is alert and oriented to person, place, and time.  Psychiatric:        Mood and  Affect: Mood normal.        Behavior: Behavior normal.    Wt Readings from Last 3 Encounters:  05/15/22 159 lb (72.1 kg)  01/13/22 164 lb 3.2 oz (74.5 kg)  09/13/21 162 lb (73.5 kg)    BP 128/70   Pulse 85   Ht '5\' 6"'  (1.676 m)   Wt 159 lb (72.1 kg)   SpO2 96%   BMI 25.66 kg/m   Assessment and Plan: 1. Type II diabetes mellitus with complication (HCC) BS are still running in the 200's but she has lost 5 lbs. Tolerating metformin 3 per day without side effects. May need to add another medication like Jardiance vs increasing metformin to 4 per day. - Basic metabolic panel - Hemoglobin A1c  2. Hyperlipidemia associated with type 2 diabetes mellitus ([place]) Tolerating statin medication without side effects at this time LDL is at goal of < 70 on current dose Continue same therapy without change at this time. - atorvastatin (LIPITOR) 10 MG tablet; Take 1 tablet (10 mg total) by mouth daily.  Dispense: 90 tablet; Refill: 1  3. Need for hepatitis C screening test - Hepatitis C antibody  4. Centrilobular emphysema ([place]) Unfortunately she continues to smoke but is trying to cut back Encourage her to use Bupropion bid  Sample of Anoro Ellipta given - call for Rx if helpful.   Partially dictated using Editor, commissioning. Any errors are unintentional.  Halina Maidens, MD [facility]  05/15/2022

## 2022-05-16 ENCOUNTER — Encounter: Payer: Self-pay | Admitting: Internal Medicine

## 2022-05-16 ENCOUNTER — Other Ambulatory Visit: Payer: Self-pay | Admitting: Internal Medicine

## 2022-05-16 DIAGNOSIS — E118 Type 2 diabetes mellitus with unspecified complications: Secondary | ICD-10-CM

## 2022-05-16 LAB — BASIC METABOLIC PANEL
BUN/Creatinine Ratio: 26 (ref 12–28)
BUN: 16 mg/dL (ref 8–27)
CO2: 22 mmol/L (ref 20–29)
Calcium: 9.9 mg/dL (ref 8.7–10.3)
Chloride: 103 mmol/L (ref 96–106)
Creatinine, Ser: 0.62 mg/dL (ref 0.57–1.00)
Glucose: 244 mg/dL — ABNORMAL HIGH (ref 70–99)
Potassium: 4.8 mmol/L (ref 3.5–5.2)
Sodium: 139 mmol/L (ref 134–144)
eGFR: 94 mL/min/{1.73_m2} (ref >59)

## 2022-05-16 LAB — HEMOGLOBIN A1C
Est. average glucose Bld gHb Est-mCnc: 232 mg/dL
Hgb A1c MFr Bld: 9.7 % — ABNORMAL HIGH (ref 4.8–5.6)

## 2022-05-16 LAB — HEPATITIS C ANTIBODY: Hep C Virus Ab: NONREACTIVE

## 2022-05-16 MED ORDER — GLIMEPIRIDE 2 MG PO TABS: 2.0000 mg | ORAL_TABLET | Freq: Every day | ORAL | 3 refills | Status: DC

## 2022-05-16 MED ORDER — DAPAGLIFLOZIN PROPANEDIOL 10 MG PO TABS: 10.0000 mg | ORAL_TABLET | Freq: Every day | ORAL | 3 refills | Status: DC

## 2022-05-20 ENCOUNTER — Other Ambulatory Visit: Payer: Self-pay | Admitting: Internal Medicine

## 2022-05-20 DIAGNOSIS — J432 Centrilobular emphysema: Secondary | ICD-10-CM

## 2022-05-20 MED ORDER — UMECLIDINIUM-VILANTEROL 62.5-25 MCG/ACT IN AEPB: 1.0000 | INHALATION_SPRAY | Freq: Every day | RESPIRATORY_TRACT | 0 refills | Status: DC

## 2022-06-02 IMAGING — CT CT CHEST LUNG CANCER SCREENING LOW DOSE W/O CM
2 of 5 series · 15 of 40 positions shown, 18 images · non-contrast
Comparison: None.

CLINICAL DATA: Current smoker with 60 pack-year history.

EXAM:
CT CHEST WITHOUT CONTRAST LOW-DOSE FOR LUNG CANCER SCREENING
TECHNIQUE: Multidetector CT imaging of the chest was performed following the
standard protocol without IV contrast.

[Series 3: lung 1.00 · axial · 0.66mm/px · z∈[-1196,-921]mm · 12 of 305 slices shown, 15 images]
[im 15/305  mediastinal]
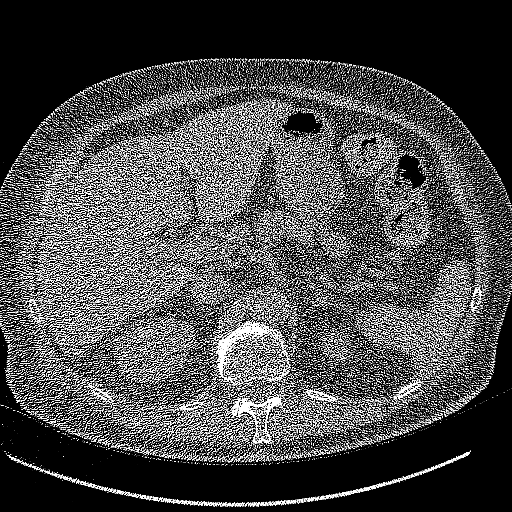
[im 15/305  lung]
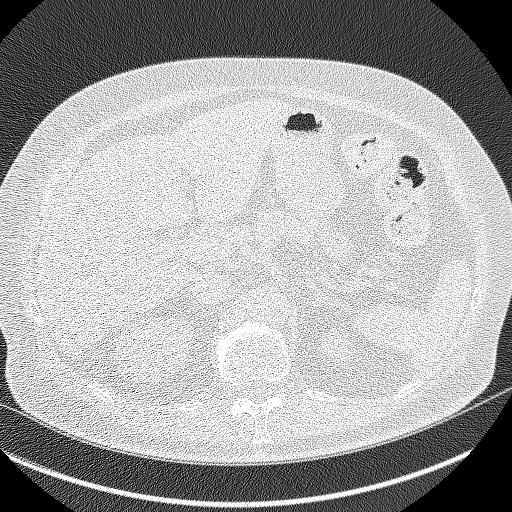
[im 44/305  lung]
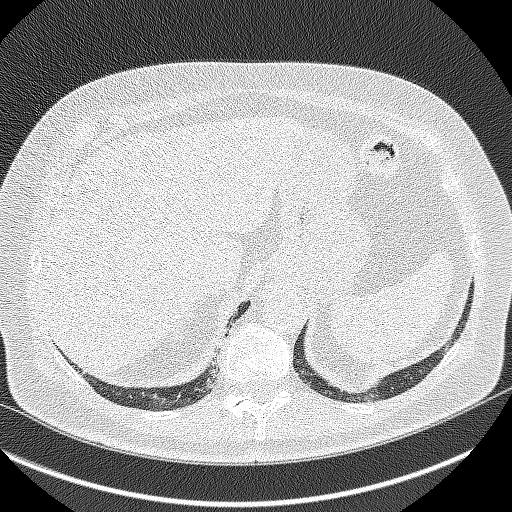
[im 73/305  lung]
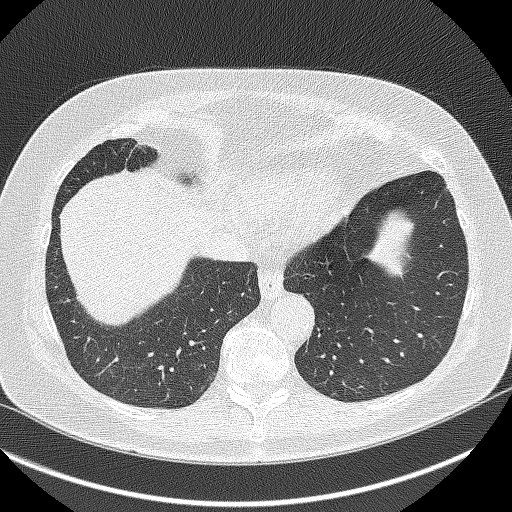
[im 87/305  lung]
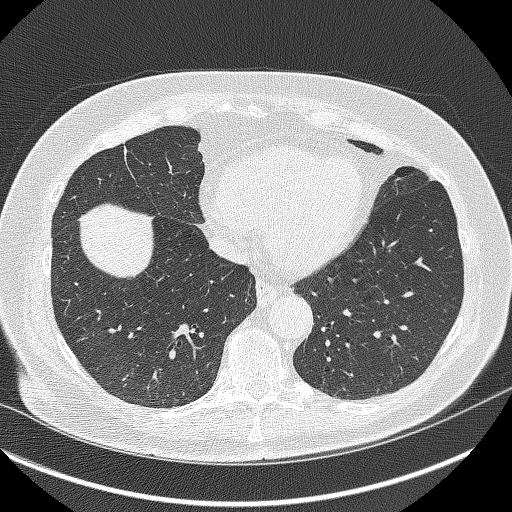
[im 116/305  mediastinal]
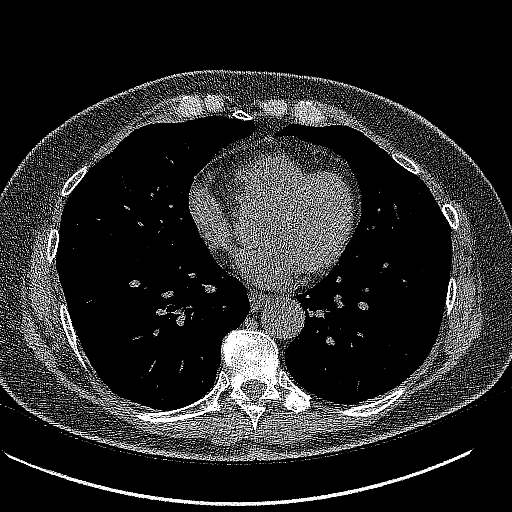
[im 116/305  lung]
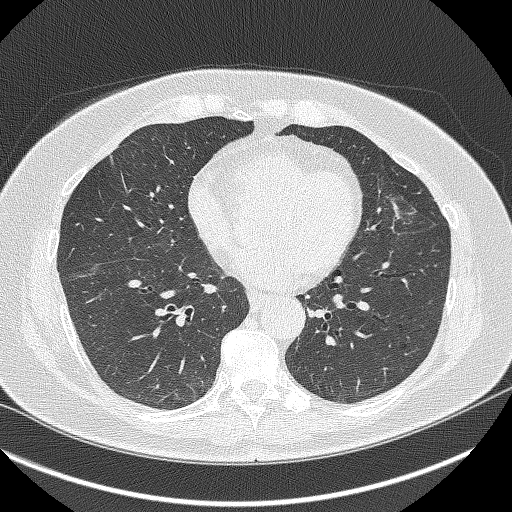
[im 145/305  lung]
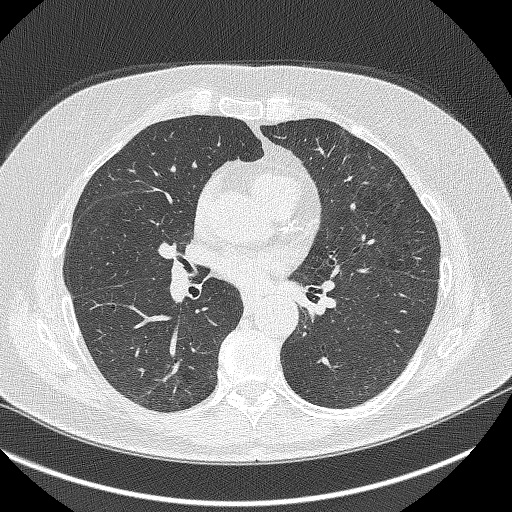
[im 160/305  lung]
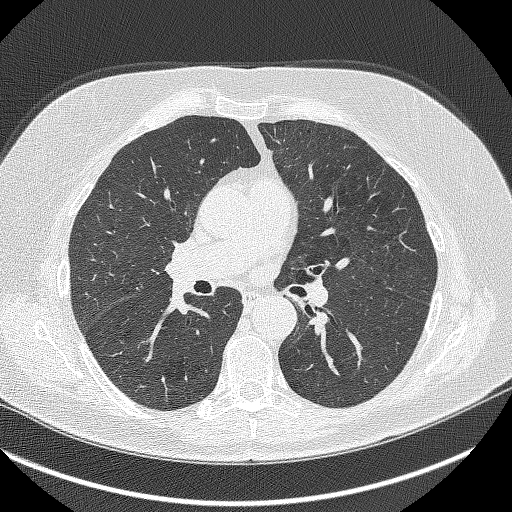
[im 189/305  lung]
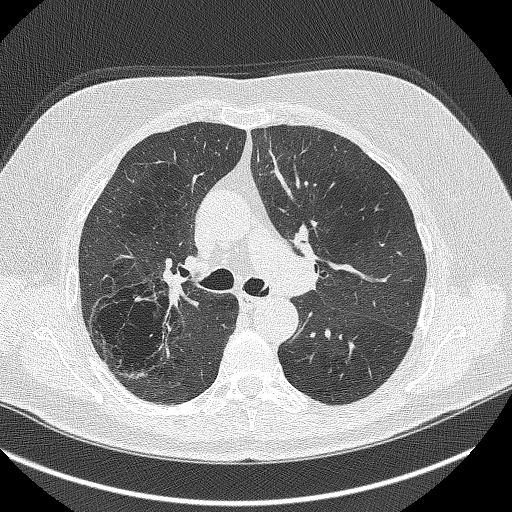
[im 218/305  mediastinal]
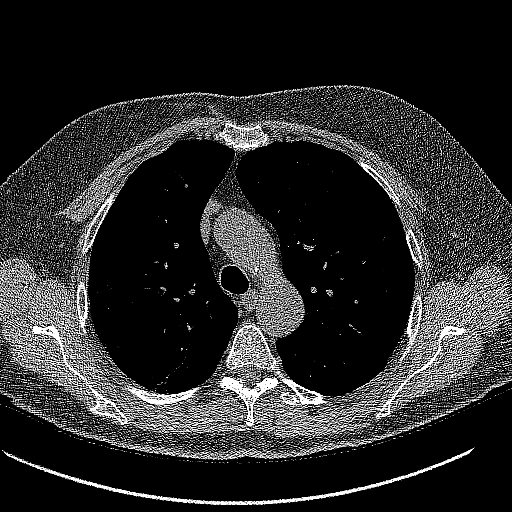
[im 218/305  lung]
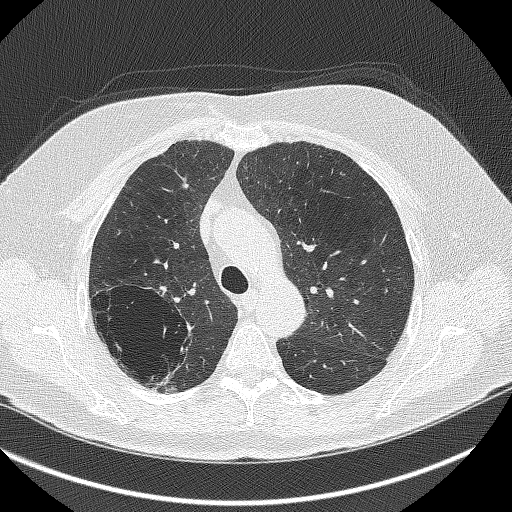
[im 232/305  lung]
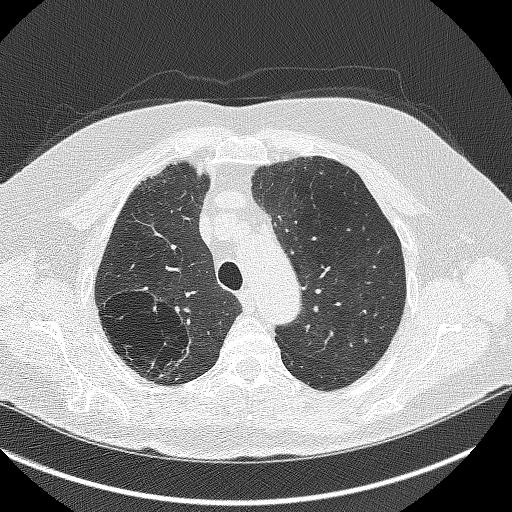
[im 261/305  lung]
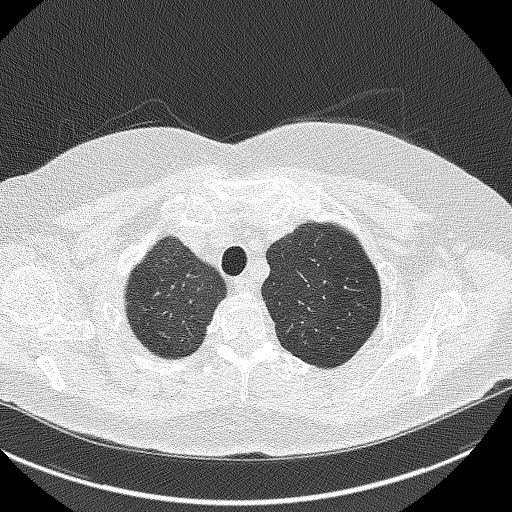
[im 290/305  lung]
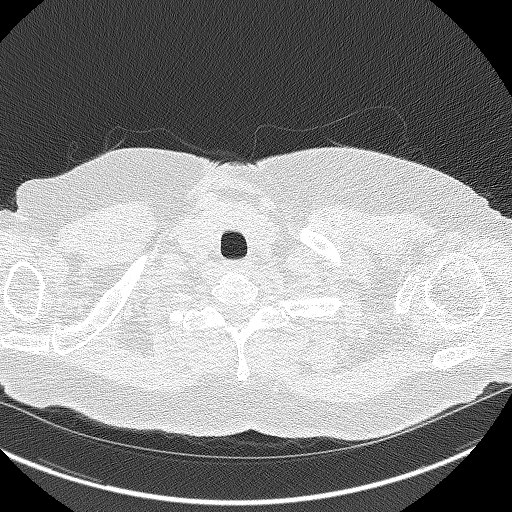

[Series 5: coronals lung 1.00 cor · coronal · 0.60mm/px · 3 of 264 slices shown]
[im 53/264  lung]
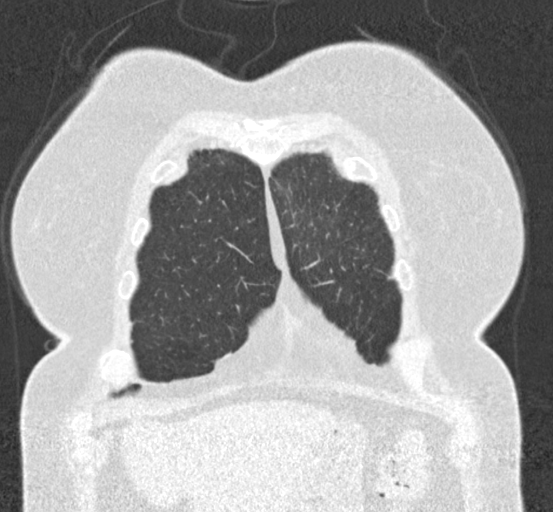
[im 106/264  lung]
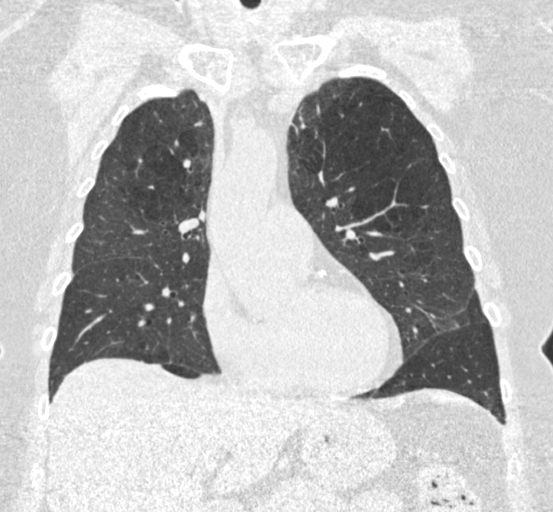
[im 158/264  lung]
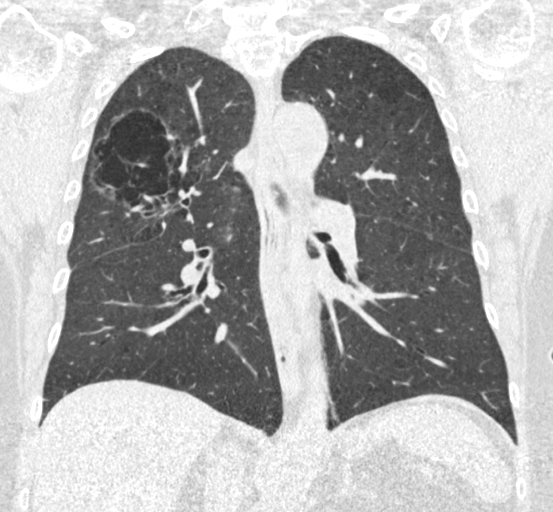

[15 of 40 positions shown; findings below may reference images not displayed]

FINDINGS: Cardiovascular: Aortic atherosclerosis. Tortuous thoracic aorta.
Normal heart size, without pericardial effusion. Lad and right
coronary artery calcification.

Mediastinum/Nodes: No mediastinal or definite hilar adenopathy,
given limitations of unenhanced CT.

Lungs/Pleura: No pleural fluid. moderate centrilobular emphysema,
including a complex lucent area in the posterior right upper lobe
which is likely a bulla or the sequelae of prior infection. Isolated
right middle lobe pulmonary nodule of volume derived equivalent
diameter 2.8 mm.

Upper Abdomen: Normal imaged portions of the liver, spleen, stomach,
pancreas, adrenal glands, kidneys.

Musculoskeletal: Osteopenia.
IMPRESSION: 1. Lung-RADS 2, benign appearance or behavior. Continue annual
screening with low- Coronary artery atherosclerosis. dose chest CT
without contrast in 12 months.
2. Aortic Atherosclerosis (MBJ51-24Y.Y) and Emphysema (MBJ51-DBX.6).

## 2022-06-18 ENCOUNTER — Other Ambulatory Visit: Payer: Self-pay

## 2022-06-18 DIAGNOSIS — Z122 Encounter for screening for malignant neoplasm of respiratory organs: Secondary | ICD-10-CM

## 2022-06-18 DIAGNOSIS — F17210 Nicotine dependence, cigarettes, uncomplicated: Secondary | ICD-10-CM

## 2022-06-18 DIAGNOSIS — Z87891 Personal history of nicotine dependence: Secondary | ICD-10-CM

## 2022-07-01 ENCOUNTER — Other Ambulatory Visit: Payer: Self-pay | Admitting: Internal Medicine

## 2022-07-01 DIAGNOSIS — F17200 Nicotine dependence, unspecified, uncomplicated: Secondary | ICD-10-CM

## 2022-07-02 ENCOUNTER — Other Ambulatory Visit: Payer: Self-pay | Admitting: Acute Care

## 2022-07-02 ENCOUNTER — Ambulatory Visit
Admission: RE | Admit: 2022-07-02 | Discharge: 2022-07-02 | Disposition: A | Discharge status: Home / Self Care | Payer: Medicare Other | Source: Ambulatory Visit | Attending: Acute Care | Admitting: Acute Care

## 2022-07-02 DIAGNOSIS — I700 Atherosclerosis of aorta: Secondary | ICD-10-CM | POA: Insufficient documentation

## 2022-07-02 DIAGNOSIS — Z122 Encounter for screening for malignant neoplasm of respiratory organs: Secondary | ICD-10-CM | POA: Insufficient documentation

## 2022-07-02 DIAGNOSIS — Z87891 Personal history of nicotine dependence: Secondary | ICD-10-CM

## 2022-07-02 DIAGNOSIS — F17210 Nicotine dependence, cigarettes, uncomplicated: Secondary | ICD-10-CM | POA: Diagnosis not present

## 2022-07-02 DIAGNOSIS — J439 Emphysema, unspecified: Secondary | ICD-10-CM | POA: Diagnosis not present

## 2022-07-02 NOTE — Telephone Encounter (Signed)
Requested Prescriptions  Pending Prescriptions Disp Refills  . buPROPion (WELLBUTRIN SR) 150 MG 12 hr tablet [Pharmacy Med Name: buPROPion HCl ER (SR) 150 MG Oral Tablet Extended Release 12 Hour] 180 tablet 0    Sig: Take 1 tablet by mouth twice daily     Psychiatry: Antidepressants - bupropion Passed - 07/01/2022  8:05 AM      Passed - Cr in normal range and within 360 days    Creatinine, Ser  Date Value Ref Range Status  05/15/2022 0.62 0.57 - 1.00 mg/dL Final         Passed - AST in normal range and within 360 days    AST  Date Value Ref Range Status  09/13/2021 16 0 - 40 IU/L Final         Passed - ALT in normal range and within 360 days    ALT  Date Value Ref Range Status  09/13/2021 20 0 - 32 IU/L Final         Passed - Last BP in normal range    BP Readings from Last 1 Encounters:  05/15/22 128/70         Passed - Valid encounter within last 6 months    Recent Outpatient Visits          1 month ago Type II diabetes mellitus with complication [facility])   [facility] Glean Hess, MD   5 months ago Type II diabetes mellitus with complication [facility])   [facility] Glean Hess, MD   9 months ago Type II diabetes mellitus with complication [facility])   [facility] Glean Hess, MD   1 year ago Type II diabetes mellitus with complication [facility])   [facility] Glean Hess, MD   1 year ago Type II diabetes mellitus with complication [facility])   [facility] Glean Hess, MD      Future Appointments            In 2 months Army Melia Jesse Sans, MD [facility], [facility]

## 2022-08-18 ENCOUNTER — Other Ambulatory Visit: Payer: Self-pay | Admitting: Internal Medicine

## 2022-08-18 DIAGNOSIS — E118 Type 2 diabetes mellitus with unspecified complications: Secondary | ICD-10-CM

## 2022-08-19 NOTE — Telephone Encounter (Signed)
Requested medications are due for refill today.  yes  Requested medications are on the active medications list.  yes  Last refill. 01/14/2022 #270 1 refill  Future visit scheduled.   yes  Notes to clinic.  Labs are expired.    Requested Prescriptions  Pending Prescriptions Disp Refills   metFORMIN (GLUCOPHAGE-XR) 500 MG 24 hr tablet [Pharmacy Med Name: metFORMIN HCl ER 500 MG Oral Tablet Extended Release 24 Hour] 270 tablet 0    Sig: TAKE 2 TABLETS (1,000MG) BY MOUTH ONCE DAILY WITH  BREAKFAST  AND  1  TABLET (500MG)  DAILY  AFTER  SUPPER     Endocrinology:  Diabetes - Biguanides Failed - 08/18/2022  8:08 AM      Failed - HBA1C is between 0 and 7.9 and within 180 days    Hgb A1c MFr Bld  Date Value Ref Range Status  05/15/2022 9.7 (H) 4.8 - 5.6 % Final    Comment:             Prediabetes: 5.7 - 6.4          Diabetes: >6.4          Glycemic control for adults with diabetes: <7.0          Failed - B12 Level in normal range and within 720 days    No results found for: "VITAMINB12"       Failed - CBC within normal limits and completed in the last 12 months    WBC  Date Value Ref Range Status  02/05/2021 13.4 (H) 3.4 - 10.8 x10E3/uL Final   RBC  Date Value Ref Range Status  02/05/2021 5.08 3.77 - 5.28 x10E6/uL Final   Hemoglobin  Date Value Ref Range Status  02/05/2021 15.3 11.1 - 15.9 g/dL Final   Hematocrit  Date Value Ref Range Status  02/05/2021 44.9 34.0 - 46.6 % Final   MCHC  Date Value Ref Range Status  02/05/2021 34.1 31.5 - 35.7 g/dL Final   [facility]  Date Value Ref Range Status  02/05/2021 30.1 26.6 - 33.0 pg Final   MCV  Date Value Ref Range Status  02/05/2021 88 79 - 97 fL Final   No results found for: "PLTCOUNTKUC", "LABPLAT", "POCPLA" RDW  Date Value Ref Range Status  02/05/2021 12.2 11.7 - 15.4 % Final         Passed - Cr in normal range and within 360 days    Creatinine, Ser  Date Value Ref Range Status  05/15/2022 0.62 0.57 - 1.00 mg/dL  Final         Passed - eGFR in normal range and within 360 days    GFR calc Af Amer  Date Value Ref Range Status  02/05/2021 107 >59 mL/min/1.73 Final    Comment:    **In accordance with recommendations from the NKF-ASN Task force,**   [facility] is in the process of updating its eGFR calculation to the   2021 CKD-EPI creatinine equation that estimates kidney function   without a race variable.    GFR calc non Af Amer  Date Value Ref Range Status  02/05/2021 93 >59 mL/min/1.73 Final   eGFR  Date Value Ref Range Status  05/15/2022 94 >59 mL/min/1.73 Final         Passed - Valid encounter within last 6 months    Recent Outpatient Visits           3 months ago Type II diabetes mellitus with complication ([place])   [facility], Jesse Sans, MD   7 months ago Type II diabetes mellitus with complication [facility])   [place] [facility] at [facility], Jesse Sans, MD   11 months ago Type II diabetes mellitus with complication [facility])   [place] [facility] at [facility], Jesse Sans, MD   1 year ago Type II diabetes mellitus with complication [facility])   [place] [facility] at [facility], Jesse Sans, MD   1 year ago Type II diabetes mellitus with complication [facility])   [place] [facility] at [facility], Jesse Sans, MD       Future Appointments             In 3 weeks Army Melia Jesse Sans, MD [place] [facility] at [facility], [facility]

## 2022-08-31 ENCOUNTER — Other Ambulatory Visit: Payer: Self-pay | Admitting: Internal Medicine

## 2022-08-31 DIAGNOSIS — E118 Type 2 diabetes mellitus with unspecified complications: Secondary | ICD-10-CM

## 2022-09-02 NOTE — Telephone Encounter (Signed)
Requested Prescriptions  Pending Prescriptions Disp Refills  . glimepiride (AMARYL) 2 MG tablet [Pharmacy Med Name: Glimepiride 2 MG Oral Tablet] 30 tablet 0    Sig: TAKE 1 TABLET BY MOUTH ONCE DAILY BEFORE BREAKFAST     Endocrinology:  Diabetes - Sulfonylureas Failed - 08/31/2022 11:17 AM      Failed - HBA1C is between 0 and 7.9 and within 180 days    Hgb A1c MFr Bld  Date Value Ref Range Status  05/15/2022 9.7 (H) 4.8 - 5.6 % Final    Comment:             Prediabetes: 5.7 - 6.4          Diabetes: >6.4          Glycemic control for adults with diabetes: <7.0          Passed - Cr in normal range and within 360 days    Creatinine, Ser  Date Value Ref Range Status  05/15/2022 0.62 0.57 - 1.00 mg/dL Final         Passed - Valid encounter within last 6 months    Recent Outpatient Visits          3 months ago Type II diabetes mellitus with complication ([place])   [place] [facility] at [facility], Jesse Sans, MD   7 months ago Type II diabetes mellitus with complication [facility])   [place] Primary Care and Sports Medicine at [facility], Jesse Sans, MD   11 months ago Type II diabetes mellitus with complication [facility])   [place] Primary Care and Sports Medicine at [facility], Jesse Sans, MD   1 year ago Type II diabetes mellitus with complication [facility])    Primary Care and Sports Medicine at [facility], Jesse Sans, MD   1 year ago Type II diabetes mellitus with complication [facility])   [place] and Sports Medicine at [facility], Jesse Sans, MD      Future Appointments            In 1 week Army Melia, Jesse Sans, MD [place] and Sports Medicine at [facility], [facility]

## 2022-09-15 ENCOUNTER — Ambulatory Visit (INDEPENDENT_AMBULATORY_CARE_PROVIDER_SITE_OTHER): Payer: Medicare Other | Admitting: Internal Medicine

## 2022-09-15 ENCOUNTER — Encounter: Payer: Self-pay | Admitting: Internal Medicine

## 2022-09-15 VITALS — BP 118/72 | HR 76 | Ht 66.0 in | Wt 164.0 lb

## 2022-09-15 DIAGNOSIS — E118 Type 2 diabetes mellitus with unspecified complications: Secondary | ICD-10-CM | POA: Diagnosis not present

## 2022-09-15 NOTE — Progress Notes (Signed)
Date:  09/15/2022   Name:  Suzanne Richardson   DOB:  04-16-1951   MRN:  [id number]   Chief Complaint: Diabetes  Diabetes She presents for her follow-up diabetic visit. She has type 2 diabetes mellitus. Her disease course has been improving. There are no hypoglycemic associated symptoms. Pertinent negatives for hypoglycemia include no headaches or tremors. Pertinent negatives for diabetes include no chest pain, no fatigue, no polydipsia and no polyuria. There are no hypoglycemic complications. Pertinent negatives for diabetic complications include no CVA, heart disease or nephropathy. Current diabetic treatment includes oral agent (dual therapy) (on metformin; glimepiride added last visit). She is compliant with treatment all of the time. Her weight is stable. Her home blood glucose trend is decreasing steadily. Her breakfast blood glucose is taken between 6-7 am. Her breakfast blood glucose range is generally 110-130 mg/dl. An ACE inhibitor/angiotensin II receptor blocker is not being taken. Eye exam is current.    Lab Results  Component Value Date   NA 139 05/15/2022   K 4.8 05/15/2022   CO2 22 05/15/2022   GLUCOSE 244 (H) 05/15/2022   BUN 16 05/15/2022   CREATININE 0.62 05/15/2022   CALCIUM 9.9 05/15/2022   EGFR 94 05/15/2022   GFRNONAA 93 02/05/2021   Lab Results  Component Value Date   CHOL 168 09/13/2021   HDL 63 09/13/2021   LDLCALC 80 09/13/2021   TRIG 145 09/13/2021   CHOLHDL 2.7 09/13/2021   Lab Results  Component Value Date   TSH 1.180 02/05/2021   Lab Results  Component Value Date   HGBA1C 9.7 (H) 05/15/2022   Lab Results  Component Value Date   WBC 13.4 (H) 02/05/2021   HGB 15.3 02/05/2021   HCT 44.9 02/05/2021   MCV 88 02/05/2021   PLT 304 02/05/2021   Lab Results  Component Value Date   ALT 20 09/13/2021   AST 16 09/13/2021   ALKPHOS 112 09/13/2021   BILITOT 0.2 09/13/2021   No results found for: "25OHVITD2", "25OHVITD3", "VD25OH"   Review of  Systems  Constitutional:  Negative for appetite change, fatigue, fever and unexpected weight change.  HENT:  Negative for tinnitus and trouble swallowing.   Eyes:  Negative for visual disturbance.  Respiratory:  Negative for cough, chest tightness and shortness of breath.   Cardiovascular:  Negative for chest pain, palpitations and leg swelling.  Gastrointestinal:  Negative for abdominal pain.  Endocrine: Negative for polydipsia and polyuria.  Genitourinary:  Negative for dysuria and hematuria.  Musculoskeletal:  Negative for arthralgias.  Neurological:  Negative for tremors, numbness and headaches.  Psychiatric/Behavioral:  Negative for dysphoric mood.     Patient Active Problem List   Diagnosis Date Noted   Centrilobular emphysema ([place]) 09/13/2021   Pulmonary nodule 1 cm or greater in diameter 09/13/2021   Aortic atherosclerosis ([place]) 07/10/2021   Hyperlipidemia associated with type 2 diabetes mellitus ([place]) 05/08/2021   Type II diabetes mellitus with complication ([place]) 45/36/4680   Tobacco use disorder 02/05/2021    No Known Allergies  Past Surgical History:  Procedure Laterality Date   CATARACT EXTRACTION W/PHACO Left 07/15/2019   Procedure: CATARACT EXTRACTION PHACO AND INTRAOCULAR LENS PLACEMENT ([place]) LEFT;  Surgeon: Eulogio Bear, MD;  Location: [place];  Service: Ophthalmology;  Laterality: Left;   CATARACT EXTRACTION W/PHACO Right 08/08/2019   Procedure: CATARACT EXTRACTION PHACO AND INTRAOCULAR LENS PLACEMENT ([facility]) RIGHT;  Surgeon: Eulogio Bear, MD;  Location: [place];  Service: Ophthalmology;  Laterality: Right;  Social History   Tobacco Use   Smoking status: Every Day    Packs/day: 1.00    Years: 60.00    Total pack years: 60.00    Types: Cigarettes   Smokeless tobacco: Never  Vaping Use   Vaping Use: Never used  Substance Use Topics   Alcohol use: Not Currently   Drug use: Never     Medication list has been reviewed  and updated.  Current Meds  Medication Sig   atorvastatin (LIPITOR) 10 MG tablet Take 1 tablet (10 mg total) by mouth daily.   buPROPion (WELLBUTRIN SR) 150 MG 12 hr tablet Take 1 tablet by mouth twice daily   glimepiride (AMARYL) 2 MG tablet TAKE 1 TABLET BY MOUTH ONCE DAILY BEFORE BREAKFAST   metFORMIN (GLUCOPHAGE-XR) 500 MG 24 hr tablet TAKE 2 TABLETS (1,000MG) BY MOUTH ONCE DAILY WITH  BREAKFAST  AND  1  TABLET (500MG)  DAILY  AFTER  SUPPER   Multiple Vitamin (MULTIVITAMIN) tablet Take 1 tablet by mouth daily.   umeclidinium-vilanterol (ANORO ELLIPTA) 62.5-25 MCG/ACT AEPB Inhale 1 puff into the lungs daily at 6 (six) AM.       09/15/2022    9:56 AM 05/15/2022    9:17 AM 05/15/2022    9:14 AM 01/13/2022    8:12 AM  GAD 7 : Generalized Anxiety Score  Nervous, Anxious, on Edge 0 0 0 0  Control/stop worrying 0 0 0 0  Worry too much - different things 0 0 0 0  Trouble relaxing 0 0 0 0  Restless 0 0 0 0  Easily annoyed or irritable 0 0 0 0  Afraid - awful might happen 0 0 0 0  Total GAD 7 Score 0 0 0 0  Anxiety Difficulty Not difficult at all Not difficult at all Not difficult at all Not difficult at all       09/15/2022    9:56 AM 05/15/2022    9:17 AM 05/15/2022    9:14 AM  Depression screen PHQ 2/9  Decreased Interest 0 0 0  Down, Depressed, Hopeless 0 0 0  PHQ - 2 Score 0 0 0  Altered sleeping 0 0 0  Tired, decreased energy '1 1 1  ' Change in appetite '1 1 1  ' Feeling bad or failure about yourself  0 0 0  Trouble concentrating 0 0 0  Moving slowly or fidgety/restless 0 0 0  Suicidal thoughts 0 0 0  PHQ-9 Score '2 2 2  ' Difficult doing work/chores Not difficult at all Not difficult at all Not difficult at all    BP Readings from Last 3 Encounters:  09/15/22 118/72  05/15/22 128/70  01/13/22 128/70    Physical Exam Vitals and nursing note reviewed.  Constitutional:      General: She is not in acute distress.    Appearance: Normal appearance. She is well-developed.   HENT:     Head: Normocephalic and atraumatic.  Neck:     Vascular: No carotid bruit.  Cardiovascular:     Rate and Rhythm: Normal rate and regular rhythm.     Pulses: Normal pulses.  Pulmonary:     Effort: Pulmonary effort is normal. No respiratory distress.     Breath sounds: No wheezing or rhonchi.  Musculoskeletal:     Cervical back: Normal range of motion.     Right lower leg: No edema.     Left lower leg: No edema.  Lymphadenopathy:     Cervical: No cervical adenopathy.  Skin:  General: Skin is warm and dry.     Capillary Refill: Capillary refill takes less than 2 seconds.     Findings: No rash.  Neurological:     General: No focal deficit present.     Mental Status: She is alert and oriented to person, place, and time.  Psychiatric:        Mood and Affect: Mood normal.        Behavior: Behavior normal.     Wt Readings from Last 3 Encounters:  09/15/22 164 lb (74.4 kg)  05/15/22 159 lb (72.1 kg)  01/13/22 164 lb 3.2 oz (74.5 kg)    BP 118/72 (BP Location: Left Arm, Cuff Size: Normal)   Pulse 76   Ht '5\' 6"'  (1.676 m)   Wt 164 lb (74.4 kg)   SpO2 96%   BMI 26.47 kg/m   Assessment and Plan: 1. Type II diabetes mellitus with complication (HCC) BS much improved with addition of glimepiride. Will continue current therapy. - Comprehensive metabolic panel - Hemoglobin A1c   Partially dictated using Editor, commissioning. Any errors are unintentional.  Halina Maidens, MD [facility]  09/15/2022

## 2022-09-16 LAB — COMPREHENSIVE METABOLIC PANEL
ALT: 19 IU/L (ref 0–32)
AST: 16 IU/L (ref 0–40)
Albumin/Globulin Ratio: 1.4 (ref 1.2–2.2)
Albumin: 4.1 g/dL (ref 3.8–4.8)
Alkaline Phosphatase: 84 IU/L (ref 44–121)
BUN/Creatinine Ratio: 15 (ref 12–28)
BUN: 8 mg/dL (ref 8–27)
Bilirubin Total: <0.2 mg/dL (ref 0.0–1.2)
CO2: 22 mmol/L (ref 20–29)
Calcium: 9.4 mg/dL (ref 8.7–10.3)
Chloride: 104 mmol/L (ref 96–106)
Creatinine, Ser: 0.53 mg/dL — ABNORMAL LOW (ref 0.57–1.00)
Globulin, Total: 3 g/dL (ref 1.5–4.5)
Glucose: 102 mg/dL — ABNORMAL HIGH (ref 70–99)
Potassium: 3.7 mmol/L (ref 3.5–5.2)
Sodium: 142 mmol/L (ref 134–144)
Total Protein: 7.1 g/dL (ref 6.0–8.5)
eGFR: 98 mL/min/{1.73_m2} (ref >59)

## 2022-09-16 LAB — HEMOGLOBIN A1C
Est. average glucose Bld gHb Est-mCnc: 169 mg/dL
Hgb A1c MFr Bld: 7.5 % — ABNORMAL HIGH (ref 4.8–5.6)

## 2022-09-22 ENCOUNTER — Other Ambulatory Visit: Payer: Self-pay | Admitting: Internal Medicine

## 2022-09-22 DIAGNOSIS — F17200 Nicotine dependence, unspecified, uncomplicated: Secondary | ICD-10-CM

## 2022-09-23 NOTE — Telephone Encounter (Signed)
Requested Prescriptions  Pending Prescriptions Disp Refills  . buPROPion (WELLBUTRIN SR) 150 MG 12 hr tablet [Pharmacy Med Name: buPROPion HCl ER (SR) 150 MG Oral Tablet Extended Release 12 Hour] 180 tablet 0    Sig: Take 1 tablet by mouth twice daily     Psychiatry: Antidepressants - bupropion Failed - 09/22/2022  9:17 AM      Failed - Cr in normal range and within 360 days    Creatinine, Ser  Date Value Ref Range Status  09/15/2022 0.53 (L) 0.57 - 1.00 mg/dL Final         Passed - AST in normal range and within 360 days    AST  Date Value Ref Range Status  09/15/2022 16 0 - 40 IU/L Final         Passed - ALT in normal range and within 360 days    ALT  Date Value Ref Range Status  09/15/2022 19 0 - 32 IU/L Final         Passed - Last BP in normal range    BP Readings from Last 1 Encounters:  09/15/22 118/72         Passed - Valid encounter within last 6 months    Recent Outpatient Visits          1 week ago Type II diabetes mellitus with complication ([place])   [place] [facility] at [facility], Jesse Sans, MD   4 months ago Type II diabetes mellitus with complication [facility])   [place] [facility] and Sports Medicine at [facility], Jesse Sans, MD   8 months ago Type II diabetes mellitus with complication [facility])   [place] [facility] and Sports Medicine at [facility], Jesse Sans, MD   1 year ago Type II diabetes mellitus with complication [facility])   [place] [facility] and Sports Medicine at [facility], Jesse Sans, MD   1 year ago Type II diabetes mellitus with complication [facility])   [place] [facility] and Sports Medicine at [facility], Jesse Sans, MD      Future Appointments            In 3 months Army Melia, Jesse Sans, MD [facility] at [facility], [facility]

## 2022-11-08 ENCOUNTER — Other Ambulatory Visit: Payer: Self-pay | Admitting: Internal Medicine

## 2022-11-08 DIAGNOSIS — E118 Type 2 diabetes mellitus with unspecified complications: Secondary | ICD-10-CM

## 2022-11-10 NOTE — Telephone Encounter (Signed)
Requested Prescriptions  Pending Prescriptions Disp Refills   metFORMIN (GLUCOPHAGE-XR) 500 MG 24 hr tablet [Pharmacy Med Name: metFORMIN HCl ER 500 MG Oral Tablet Extended Release 24 Hour] 270 tablet 1    Sig: TAKE 2 TABLETS BY MOUTH ONCE DAILY WITH BREAKFAST AND  1  TABLET  DAILY  AFTER  SUPPER     Endocrinology:  Diabetes - Biguanides Failed - 11/08/2022 10:23 AM      Failed - Cr in normal range and within 360 days    Creatinine, Ser  Date Value Ref Range Status  09/15/2022 0.53 (L) 0.57 - 1.00 mg/dL Final         Failed - B12 Level in normal range and within 720 days    No results found for: "VITAMINB12"       Failed - CBC within normal limits and completed in the last 12 months    WBC  Date Value Ref Range Status  02/05/2021 13.4 (H) 3.4 - 10.8 x10E3/uL Final   RBC  Date Value Ref Range Status  02/05/2021 5.08 3.77 - 5.28 x10E6/uL Final   Hemoglobin  Date Value Ref Range Status  02/05/2021 15.3 11.1 - 15.9 g/dL Final   Hematocrit  Date Value Ref Range Status  02/05/2021 44.9 34.0 - 46.6 % Final   MCHC  Date Value Ref Range Status  02/05/2021 34.1 31.5 - 35.7 g/dL Final   [facility]  Date Value Ref Range Status  02/05/2021 30.1 26.6 - 33.0 pg Final   MCV  Date Value Ref Range Status  02/05/2021 88 79 - 97 fL Final   No results found for: "PLTCOUNTKUC", "LABPLAT", "POCPLA" RDW  Date Value Ref Range Status  02/05/2021 12.2 11.7 - 15.4 % Final         Passed - HBA1C is between 0 and 7.9 and within 180 days    Hgb A1c MFr Bld  Date Value Ref Range Status  09/15/2022 7.5 (H) 4.8 - 5.6 % Final    Comment:             Prediabetes: 5.7 - 6.4          Diabetes: >6.4          Glycemic control for adults with diabetes: <7.0          Passed - eGFR in normal range and within 360 days    GFR calc Af Amer  Date Value Ref Range Status  02/05/2021 107 >59 mL/min/1.73 Final    Comment:    **In accordance with recommendations from the NKF-ASN Task force,**   [facility] is  in the process of updating its eGFR calculation to the   2021 CKD-EPI creatinine equation that estimates kidney function   without a race variable.    GFR calc non Af Amer  Date Value Ref Range Status  02/05/2021 93 >59 mL/min/1.73 Final   eGFR  Date Value Ref Range Status  09/15/2022 98 >59 mL/min/1.73 Final         Passed - Valid encounter within last 6 months    Recent Outpatient Visits           1 month ago Type II diabetes mellitus with complication [facility])   [place] [facility] Medicine at [facility], Jesse Sans, MD   5 months ago Type II diabetes mellitus with complication [facility])   [place] [facility] and Sports Medicine at [facility], Jesse Sans, MD   10 months ago Type II diabetes  mellitus with complication [facility])   [place] [facility] Care and Sports Medicine at [facility], Jesse Sans, MD   1 year ago Type II diabetes mellitus with complication [facility])   [place] [facility] Care and Sports Medicine at [facility], Jesse Sans, MD   1 year ago Type II diabetes mellitus with complication [facility])   [place] Primary Care and Sports Medicine at [facility], Jesse Sans, MD       Future Appointments             In 2 months Army Melia, Jesse Sans, MD [place] [facility] and Sports Medicine at [facility], [facility]

## 2022-12-02 ENCOUNTER — Other Ambulatory Visit: Payer: Self-pay | Admitting: Internal Medicine

## 2022-12-02 DIAGNOSIS — E118 Type 2 diabetes mellitus with unspecified complications: Secondary | ICD-10-CM

## 2022-12-02 NOTE — Telephone Encounter (Signed)
Requested Prescriptions  Pending Prescriptions Disp Refills   glimepiride (AMARYL) 2 MG tablet [Pharmacy Med Name: Glimepiride 2 MG Oral Tablet] 90 tablet 0    Sig: TAKE 1 TABLET BY MOUTH ONCE DAILY BEFORE BREAKFAST     Endocrinology:  Diabetes - Sulfonylureas Failed - 12/02/2022  6:52 AM      Failed - Cr in normal range and within 360 days    Creatinine, Ser  Date Value Ref Range Status  09/15/2022 0.53 (L) 0.57 - 1.00 mg/dL Final         Passed - HBA1C is between 0 and 7.9 and within 180 days    Hgb A1c MFr Bld  Date Value Ref Range Status  09/15/2022 7.5 (H) 4.8 - 5.6 % Final    Comment:             Prediabetes: 5.7 - 6.4          Diabetes: >6.4          Glycemic control for adults with diabetes: <7.0          Passed - Valid encounter within last 6 months    Recent Outpatient Visits           2 months ago Type II diabetes mellitus with complication ([place])   [place] [facility] and Sports Medicine at [facility], Jesse Sans, MD   6 months ago Type II diabetes mellitus with complication [facility])   [place] [facility] Care and Sports Medicine at [facility], Jesse Sans, MD   10 months ago Type II diabetes mellitus with complication [facility])   [place] [facility] Care and Sports Medicine at [facility], Jesse Sans, MD   1 year ago Type II diabetes mellitus with complication [facility])   [place] [facility] Care and Sports Medicine at [facility], Jesse Sans, MD   1 year ago Type II diabetes mellitus with complication [facility])   [place] Primary Care and Sports Medicine at [facility], Jesse Sans, MD       Future Appointments             In 1 month Army Melia, Jesse Sans, MD [facility] Primary Care and Sports Medicine at [facility], [facility]

## 2022-12-24 ENCOUNTER — Other Ambulatory Visit: Payer: Self-pay | Admitting: Internal Medicine

## 2022-12-24 DIAGNOSIS — F17200 Nicotine dependence, unspecified, uncomplicated: Secondary | ICD-10-CM

## 2023-01-15 ENCOUNTER — Other Ambulatory Visit: Payer: Self-pay | Admitting: Internal Medicine

## 2023-01-15 DIAGNOSIS — E1169 Type 2 diabetes mellitus with other specified complication: Secondary | ICD-10-CM

## 2023-01-15 NOTE — Telephone Encounter (Signed)
Requested medications are due for refill today.  yes  Requested medications are on the active medications list.  yes  Last refill. 05/15/2022  Future visit scheduled.   yes  Notes to clinic.  Labs are expired.    Requested Prescriptions  Pending Prescriptions Disp Refills   atorvastatin (LIPITOR) 10 MG tablet [Pharmacy Med Name: Atorvastatin Calcium 10 MG Oral Tablet] 90 tablet 0    Sig: Take 1 tablet by mouth once daily     Cardiovascular:  Antilipid - Statins Failed - 01/15/2023  6:52 AM      Failed - Lipid Panel in normal range within the last 12 months    Cholesterol, Total  Date Value Ref Range Status  09/13/2021 168 100 - 199 mg/dL Final   LDL Chol Calc (NIH)  Date Value Ref Range Status  09/13/2021 80 0 - 99 mg/dL Final   HDL  Date Value Ref Range Status  09/13/2021 63 >39 mg/dL Final   Triglycerides  Date Value Ref Range Status  09/13/2021 145 0 - 149 mg/dL Final         Passed - Patient is not pregnant      Passed - Valid encounter within last 12 months    Recent Outpatient Visits           4 months ago Type II diabetes mellitus with complication ([place])   [place] [facility] Care and Sports Medicine at [facility], Jesse Sans, MD   8 months ago Type II diabetes mellitus with complication [facility])   [place] Primary Care and Sports Medicine at [facility], Jesse Sans, MD   1 year ago Type II diabetes mellitus with complication [facility])   [place] Primary Care and Sports Medicine at [facility], Jesse Sans, MD   1 year ago Type II diabetes mellitus with complication [facility])   [place] Primary Care and Sports Medicine at [facility], Jesse Sans, MD   1 year ago Type II diabetes mellitus with complication [facility])   [place] [facility] Care and Sports Medicine at [facility], Jesse Sans, MD       Future Appointments             In 5 days Army Melia Jesse Sans, MD [place] and Sports Medicine  at [facility], [facility]

## 2023-01-20 ENCOUNTER — Encounter: Payer: Self-pay | Admitting: Internal Medicine

## 2023-01-20 ENCOUNTER — Ambulatory Visit (INDEPENDENT_AMBULATORY_CARE_PROVIDER_SITE_OTHER): Payer: Medicare Other | Admitting: Internal Medicine

## 2023-01-20 VITALS — BP 124/76 | HR 80 | Ht 66.0 in | Wt 165.0 lb

## 2023-01-20 DIAGNOSIS — J432 Centrilobular emphysema: Secondary | ICD-10-CM | POA: Diagnosis not present

## 2023-01-20 DIAGNOSIS — E785 Hyperlipidemia, unspecified: Secondary | ICD-10-CM | POA: Diagnosis not present

## 2023-01-20 DIAGNOSIS — E1169 Type 2 diabetes mellitus with other specified complication: Secondary | ICD-10-CM | POA: Diagnosis not present

## 2023-01-20 DIAGNOSIS — F17200 Nicotine dependence, unspecified, uncomplicated: Secondary | ICD-10-CM

## 2023-01-20 DIAGNOSIS — R911 Solitary pulmonary nodule: Secondary | ICD-10-CM

## 2023-01-20 DIAGNOSIS — I700 Atherosclerosis of aorta: Secondary | ICD-10-CM | POA: Diagnosis not present

## 2023-01-20 DIAGNOSIS — E118 Type 2 diabetes mellitus with unspecified complications: Secondary | ICD-10-CM | POA: Diagnosis not present

## 2023-01-20 NOTE — Progress Notes (Signed)
Date:  01/20/2023   Name:  Genifer Lazenby   DOB:  1951/05/17   MRN:  [id number]   Chief Complaint: Annual Exam (Breast exam no pap ) Zyia Kaneko is a 72 y.o. female who presents today for her Complete Annual Exam. She feels well. She reports exercising none. She reports she is sleeping fairly well. Breast complaints none.  Mammogram: none - declined DEXA: declined Colonoscopy: none  Health Maintenance Due  Topic Date Due   Pneumonia Vaccine 57+ Years old (1 - PCV) Never done   DTaP/Tdap/Td (1 - Tdap) Never done   Diabetic kidney evaluation - Urine ACR  01/13/2023     There is no immunization history on file for this patient.  Hyperlipidemia This is a chronic problem. The problem is controlled. Pertinent negatives include no chest pain or shortness of breath. Current antihyperlipidemic treatment includes statins. The current treatment provides moderate improvement of lipids.  Diabetes She presents for her follow-up diabetic visit. She has type 2 diabetes mellitus. Her disease course has been stable. Pertinent negatives for hypoglycemia include no dizziness, headaches, nervousness/anxiousness or tremors. Pertinent negatives for diabetes include no chest pain, no fatigue, no polydipsia and no polyuria. Her weight is stable. She is following a generally healthy diet. Her breakfast blood glucose is taken between 6-7 am. Her breakfast blood glucose range is generally 130-140 mg/dl.    Lab Results  Component Value Date   NA 142 09/15/2022   K 3.7 09/15/2022   CO2 22 09/15/2022   GLUCOSE 102 (H) 09/15/2022   BUN 8 09/15/2022   CREATININE 0.53 (L) 09/15/2022   CALCIUM 9.4 09/15/2022   EGFR 98 09/15/2022   GFRNONAA 93 02/05/2021   Lab Results  Component Value Date   CHOL 168 09/13/2021   HDL 63 09/13/2021   LDLCALC 80 09/13/2021   TRIG 145 09/13/2021   CHOLHDL 2.7 09/13/2021   Lab Results  Component Value Date   TSH 1.180 02/05/2021   Lab Results  Component Value Date    HGBA1C 7.5 (H) 09/15/2022   Lab Results  Component Value Date   WBC 13.4 (H) 02/05/2021   HGB 15.3 02/05/2021   HCT 44.9 02/05/2021   MCV 88 02/05/2021   PLT 304 02/05/2021   Lab Results  Component Value Date   ALT 19 09/15/2022   AST 16 09/15/2022   ALKPHOS 84 09/15/2022   BILITOT <0.2 09/15/2022   No results found for: "25OHVITD2", "25OHVITD3", "VD25OH"   Review of Systems  Constitutional:  Negative for chills, fatigue and fever.  HENT:  Negative for congestion, hearing loss, tinnitus, trouble swallowing and voice change.   Eyes:  Negative for visual disturbance.  Respiratory:  Negative for cough, chest tightness, shortness of breath and wheezing.   Cardiovascular:  Negative for chest pain, palpitations and leg swelling.  Gastrointestinal:  Negative for abdominal pain, constipation, diarrhea and vomiting.  Endocrine: Negative for polydipsia and polyuria.  Genitourinary:  Negative for dysuria, frequency, genital sores, vaginal bleeding and vaginal discharge.  Musculoskeletal:  Negative for arthralgias, gait problem and joint swelling.  Skin:  Negative for color change and rash.  Neurological:  Negative for dizziness, tremors, light-headedness and headaches.  Hematological:  Negative for adenopathy. Does not bruise/bleed easily.  Psychiatric/Behavioral:  Negative for dysphoric mood and sleep disturbance. The patient is not nervous/anxious.     Patient Active Problem List   Diagnosis Date Noted   Centrilobular emphysema ([place]) 09/13/2021   Pulmonary nodule 1 cm or greater in diameter  09/13/2021   Aortic atherosclerosis ([place]) 07/10/2021   Hyperlipidemia associated with type 2 diabetes mellitus ([place]) 05/08/2021   Type II diabetes mellitus with complication ([place]) 28/78/6767   Tobacco use disorder 02/05/2021    No Known Allergies  Past Surgical History:  Procedure Laterality Date   CATARACT EXTRACTION W/PHACO Left 07/15/2019   Procedure: CATARACT EXTRACTION PHACO AND  INTRAOCULAR LENS PLACEMENT ([place]) LEFT;  Surgeon: Eulogio Bear, MD;  Location: [place];  Service: Ophthalmology;  Laterality: Left;   CATARACT EXTRACTION W/PHACO Right 08/08/2019   Procedure: CATARACT EXTRACTION PHACO AND INTRAOCULAR LENS PLACEMENT ([facility]) RIGHT;  Surgeon: Eulogio Bear, MD;  Location: [place];  Service: Ophthalmology;  Laterality: Right;    Social History   Tobacco Use   Smoking status: Every Day    Packs/day: 1.00    Years: 60.00    Total pack years: 60.00    Types: Cigarettes   Smokeless tobacco: Never   Tobacco comments:    Cut back to < 1 ppd  Vaping Use   Vaping Use: Never used  Substance Use Topics   Alcohol use: Not Currently   Drug use: Never     Medication list has been reviewed and updated.  Current Meds  Medication Sig   atorvastatin (LIPITOR) 10 MG tablet Take 1 tablet by mouth once daily   buPROPion (WELLBUTRIN SR) 150 MG 12 hr tablet Take 1 tablet by mouth twice daily   glimepiride (AMARYL) 2 MG tablet TAKE 1 TABLET BY MOUTH ONCE DAILY BEFORE BREAKFAST   metFORMIN (GLUCOPHAGE-XR) 500 MG 24 hr tablet TAKE 2 TABLETS BY MOUTH ONCE DAILY WITH BREAKFAST AND  1  TABLET  DAILY  AFTER  SUPPER   Multiple Vitamin (MULTIVITAMIN) tablet Take 1 tablet by mouth daily.   [DISCONTINUED] umeclidinium-vilanterol (ANORO ELLIPTA) 62.5-25 MCG/ACT AEPB Inhale 1 puff into the lungs daily at 6 (six) AM.       01/20/2023    9:39 AM 09/15/2022    9:56 AM 05/15/2022    9:17 AM 05/15/2022    9:14 AM  GAD 7 : Generalized Anxiety Score  Nervous, Anxious, on Edge 0 0 0 0  Control/stop worrying 0 0 0 0  Worry too much - different things 0 0 0 0  Trouble relaxing 0 0 0 0  Restless 0 0 0 0  Easily annoyed or irritable 0 0 0 0  Afraid - awful might happen 0 0 0 0  Total GAD 7 Score 0 0 0 0  Anxiety Difficulty Not difficult at all Not difficult at all Not difficult at all Not difficult at all       01/20/2023    9:39 AM 09/15/2022    9:56  AM 05/15/2022    9:17 AM  Depression screen PHQ 2/9  Decreased Interest 1 0 0  Down, Depressed, Hopeless 0 0 0  PHQ - 2 Score 1 0 0  Altered sleeping 2 0 0  Tired, decreased energy 0 1 1  Change in appetite 0 1 1  Feeling bad or failure about yourself  0 0 0  Trouble concentrating 0 0 0  Moving slowly or fidgety/restless 0 0 0  Suicidal thoughts 0 0 0  PHQ-9 Score '3 2 2  '$ Difficult doing work/chores Not difficult at all Not difficult at all Not difficult at all    BP Readings from Last 3 Encounters:  01/20/23 124/76  09/15/22 118/72  05/15/22 128/70    Physical Exam Vitals and nursing note  reviewed.  Constitutional:      General: She is not in acute distress.    Appearance: She is well-developed.  HENT:     Head: Normocephalic and atraumatic.     Right Ear: Tympanic membrane and ear canal normal.     Left Ear: Tympanic membrane and ear canal normal.     Nose:     Right Sinus: No maxillary sinus tenderness.     Left Sinus: No maxillary sinus tenderness.  Eyes:     General: No scleral icterus.       Right eye: No discharge.        Left eye: No discharge.     Conjunctiva/sclera: Conjunctivae normal.  Neck:     Thyroid: No thyromegaly.     Vascular: No carotid bruit.  Cardiovascular:     Rate and Rhythm: Normal rate and regular rhythm.     Pulses: Normal pulses.     Heart sounds: Normal heart sounds.  Pulmonary:     Effort: Pulmonary effort is normal. No respiratory distress.     Breath sounds: No wheezing.  Chest:  Breasts:    Right: No mass, nipple discharge, skin change or tenderness.     Left: No mass, nipple discharge, skin change or tenderness.  Abdominal:     General: Bowel sounds are normal.     Palpations: Abdomen is soft.     Tenderness: There is no abdominal tenderness.  Musculoskeletal:     Cervical back: Normal range of motion. No erythema.     Right lower leg: No edema.     Left lower leg: No edema.  Lymphadenopathy:     Cervical: No cervical  adenopathy.  Skin:    General: Skin is warm and dry.     Findings: No rash.  Neurological:     Mental Status: She is alert and oriented to person, place, and time.     Cranial Nerves: No cranial nerve deficit.     Sensory: No sensory deficit.     Deep Tendon Reflexes: Reflexes are normal and symmetric.  Psychiatric:        Attention and Perception: Attention normal.        Mood and Affect: Mood normal.    Diabetic Foot Exam - Simple   Simple Foot Form Diabetic Foot exam was performed with the following findings: Yes 01/20/2023 10:04 AM  Visual Inspection No deformities, no ulcerations, no other skin breakdown bilaterally: Yes Sensation Testing Intact to touch and monofilament testing bilaterally: Yes Pulse Check Posterior Tibialis and Dorsalis pulse intact bilaterally: Yes Comments      Wt Readings from Last 3 Encounters:  01/20/23 165 lb (74.8 kg)  09/15/22 164 lb (74.4 kg)  05/15/22 159 lb (72.1 kg)    BP 124/76   Pulse 80   Ht '5\' 6"'$  (1.676 m)   Wt 165 lb (74.8 kg)   SpO2 95%   BMI 26.63 kg/m   Assessment and Plan: Problem List Items Addressed This Visit       Cardiovascular and Mediastinum   Aortic atherosclerosis ([place]) (Chronic)    Seen on CT lung cancer screening Currently on atorvastatin 10 mg without side effects        Respiratory   Centrilobular emphysema (HCC) (Chronic)    Continue to smoke but trying to cut back Not on any therapy - does not feel limited by her lung condition      Pulmonary nodule 1 cm or greater in diameter    Repeat  scan 06/2022 showed stable nodule and no evidence of malignancy      Relevant Orders   CBC with Differential/Platelet     Endocrine   Hyperlipidemia associated with type 2 diabetes mellitus ([place]) (Chronic)    Tolerating statin medication without side effects at this time LDL is  Lab Results  Component Value Date   LDLCALC 80 09/13/2021  with a goal of < 70. Current dose will be adjusted if needed.        Relevant Orders   Lipid panel   Type II diabetes mellitus with complication (HCC) - Primary (Chronic)    Clinically stable without s/s of hypoglycemia on metformin and glimepiride Tolerating medications well without side effects or other concerns. Continue without changes at this time.       Relevant Orders   Comprehensive metabolic panel   Hemoglobin A1c   Microalbumin / creatinine urine ratio     Other   Tobacco use disorder    Participating in the lung cancer screening program Cutting back some but not ready to quit completely       Patient declined HM screenings - Mammo, DEXA, colonoscopy. Partially dictated using Editor, commissioning. Any errors are unintentional.  Halina Maidens, MD [facility]  01/20/2023\

## 2023-01-20 NOTE — Assessment & Plan Note (Signed)
Tolerating statin medication without side effects at this time LDL is  Lab Results  Component Value Date   LDLCALC 80 09/13/2021   with a goal of < 70. Current dose will be adjusted if needed.

## 2023-01-20 NOTE — Assessment & Plan Note (Addendum)
Continue to smoke but trying to cut back Not on any therapy - does not feel limited by her lung condition Declines flu and pneumonia vaccines

## 2023-01-20 NOTE — Assessment & Plan Note (Signed)
Seen on CT lung cancer screening Currently on atorvastatin 10 mg without side effects

## 2023-01-20 NOTE — Assessment & Plan Note (Signed)
Participating in the lung cancer screening program Cutting back some but not ready to quit completely

## 2023-01-20 NOTE — Assessment & Plan Note (Signed)
Repeat scan 06/2022 showed stable nodule and no evidence of malignancy

## 2023-01-20 NOTE — Assessment & Plan Note (Signed)
Clinically stable without s/s of hypoglycemia on metformin and glimepiride Tolerating medications well without side effects or other concerns. Continue without changes at this time.

## 2023-01-21 LAB — MICROALBUMIN / CREATININE URINE RATIO
Creatinine, Urine: 114.3 mg/dL
Microalb/Creat Ratio: 7 mg/g creat (ref 0–29)
Microalbumin, Urine: 8.2 ug/mL

## 2023-01-21 LAB — LIPID PANEL
Chol/HDL Ratio: 2.5 ratio (ref 0.0–4.4)
Cholesterol, Total: 155 mg/dL (ref 100–199)
HDL: 61 mg/dL (ref >39)
LDL Chol Calc (NIH): 73 mg/dL (ref 0–99)
Triglycerides: 121 mg/dL (ref 0–149)
VLDL Cholesterol Cal: 21 mg/dL (ref 5–40)

## 2023-01-21 LAB — COMPREHENSIVE METABOLIC PANEL
ALT: 18 IU/L (ref 0–32)
AST: 14 IU/L (ref 0–40)
Albumin/Globulin Ratio: 1.5 (ref 1.2–2.2)
Albumin: 4.4 g/dL (ref 3.8–4.8)
Alkaline Phosphatase: 85 IU/L (ref 44–121)
BUN/Creatinine Ratio: 15 (ref 12–28)
BUN: 10 mg/dL (ref 8–27)
Bilirubin Total: 0.2 mg/dL (ref 0.0–1.2)
CO2: 21 mmol/L (ref 20–29)
Calcium: 9.6 mg/dL (ref 8.7–10.3)
Chloride: 102 mmol/L (ref 96–106)
Creatinine, Ser: 0.67 mg/dL (ref 0.57–1.00)
Globulin, Total: 2.9 g/dL (ref 1.5–4.5)
Glucose: 128 mg/dL — ABNORMAL HIGH (ref 70–99)
Potassium: 3.7 mmol/L (ref 3.5–5.2)
Sodium: 140 mmol/L (ref 134–144)
Total Protein: 7.3 g/dL (ref 6.0–8.5)
eGFR: 92 mL/min/{1.73_m2} (ref >59)

## 2023-01-21 LAB — CBC WITH DIFFERENTIAL/PLATELET
Basophils Absolute: 0 10*3/uL (ref 0.0–0.2)
Basos: 0 %
EOS (ABSOLUTE): 0.1 10*3/uL (ref 0.0–0.4)
Eos: 1 %
Hematocrit: 39.4 % (ref 34.0–46.6)
Hemoglobin: 13.6 g/dL (ref 11.1–15.9)
Immature Grans (Abs): 0.1 10*3/uL (ref 0.0–0.1)
Immature Granulocytes: 1 %
Lymphocytes Absolute: 2.6 10*3/uL (ref 0.7–3.1)
Lymphs: 25 %
MCH: 31.4 pg (ref 26.6–33.0)
MCHC: 34.5 g/dL (ref 31.5–35.7)
MCV: 91 fL (ref 79–97)
Monocytes Absolute: 0.7 10*3/uL (ref 0.1–0.9)
Monocytes: 6 %
Neutrophils Absolute: 7 10*3/uL (ref 1.4–7.0)
Neutrophils: 67 %
Platelets: 304 10*3/uL (ref 150–450)
RBC: 4.33 x10E6/uL (ref 3.77–5.28)
RDW: 12.6 % (ref 11.7–15.4)
WBC: 10.4 10*3/uL (ref 3.4–10.8)

## 2023-01-21 LAB — HEMOGLOBIN A1C
Est. average glucose Bld gHb Est-mCnc: 163 mg/dL
Hgb A1c MFr Bld: 7.3 % — ABNORMAL HIGH (ref 4.8–5.6)

## 2023-02-26 ENCOUNTER — Other Ambulatory Visit: Payer: Self-pay | Admitting: Internal Medicine

## 2023-02-26 DIAGNOSIS — E118 Type 2 diabetes mellitus with unspecified complications: Secondary | ICD-10-CM

## 2023-03-25 ENCOUNTER — Other Ambulatory Visit: Payer: Self-pay | Admitting: Internal Medicine

## 2023-03-25 DIAGNOSIS — F17200 Nicotine dependence, unspecified, uncomplicated: Secondary | ICD-10-CM

## 2023-03-25 NOTE — Telephone Encounter (Signed)
Requested Prescriptions  Pending Prescriptions Disp Refills   buPROPion (WELLBUTRIN SR) 150 MG 12 hr tablet [Pharmacy Med Name: buPROPion HCl ER (SR) 150 MG Oral Tablet Extended Release 12 Hour] 180 tablet 0    Sig: Take 1 tablet by mouth twice daily     Psychiatry: Antidepressants - bupropion Passed - 03/25/2023  9:44 AM      Passed - Cr in normal range and within 360 days    Creatinine, Ser  Date Value Ref Range Status  01/20/2023 0.67 0.57 - 1.00 mg/dL Final         Passed - AST in normal range and within 360 days    AST  Date Value Ref Range Status  01/20/2023 14 0 - 40 IU/L Final         Passed - ALT in normal range and within 360 days    ALT  Date Value Ref Range Status  01/20/2023 18 0 - 32 IU/L Final         Passed - Last BP in normal range    BP Readings from Last 1 Encounters:  01/20/23 124/76         Passed - Valid encounter within last 6 months    Recent Outpatient Visits           2 months ago Type II diabetes mellitus with complication [facility])   [place] [facility] at [facility], Jesse Sans, MD   6 months ago Type II diabetes mellitus with complication [facility])   [place] [facility] at [facility], Jesse Sans, MD   10 months ago Type II diabetes mellitus with complication [facility])   [place] [facility] at [facility], Jesse Sans, MD   1 year ago Type II diabetes mellitus with complication [facility])   [place] [facility] at [facility], Jesse Sans, MD   1 year ago Type II diabetes mellitus with complication [facility])   [place] [facility] at [facility], Jesse Sans, MD       Future Appointments             In 2 months Army Melia, Jesse Sans, MD [place] at [facility], [facility]

## 2023-04-14 ENCOUNTER — Other Ambulatory Visit: Payer: Self-pay | Admitting: Internal Medicine

## 2023-04-14 DIAGNOSIS — E1169 Type 2 diabetes mellitus with other specified complication: Secondary | ICD-10-CM

## 2023-04-15 NOTE — Telephone Encounter (Signed)
Requested Prescriptions  Pending Prescriptions Disp Refills   atorvastatin (LIPITOR) 10 MG tablet [Pharmacy Med Name: Atorvastatin Calcium 10 MG Oral Tablet] 90 tablet 1    Sig: Take 1 tablet by mouth once daily     Cardiovascular:  Antilipid - Statins Failed - 04/14/2023  6:53 AM      Failed - Lipid Panel in normal range within the last 12 months    Cholesterol, Total  Date Value Ref Range Status  01/20/2023 155 100 - 199 mg/dL Final   LDL Chol Calc (NIH)  Date Value Ref Range Status  01/20/2023 73 0 - 99 mg/dL Final   HDL  Date Value Ref Range Status  01/20/2023 61 >39 mg/dL Final   Triglycerides  Date Value Ref Range Status  01/20/2023 121 0 - 149 mg/dL Final         Passed - Patient is not pregnant      Passed - Valid encounter within last 12 months    Recent Outpatient Visits           2 months ago Type II diabetes mellitus with complication (HCC)   [place] [facility] at [facility], Nyoka Cowden, MD   7 months ago Type II diabetes mellitus with complication [facility])   [place] [facility] at [facility], Nyoka Cowden, MD   11 months ago Type II diabetes mellitus with complication [facility])   [place] [facility] at [facility], Nyoka Cowden, MD   1 year ago Type II diabetes mellitus with complication [facility])   [place] [facility] at [facility], Nyoka Cowden, MD   1 year ago Type II diabetes mellitus with complication [facility])   [place] [facility] at [facility], Nyoka Cowden, MD       Future Appointments             In 1 month Judithann Graves, Nyoka Cowden, MD [facility] at [facility]

## 2023-05-05 ENCOUNTER — Other Ambulatory Visit: Payer: Self-pay | Admitting: Internal Medicine

## 2023-05-05 DIAGNOSIS — E118 Type 2 diabetes mellitus with unspecified complications: Secondary | ICD-10-CM

## 2023-05-26 ENCOUNTER — Other Ambulatory Visit: Payer: Self-pay | Admitting: Internal Medicine

## 2023-05-26 DIAGNOSIS — E118 Type 2 diabetes mellitus with unspecified complications: Secondary | ICD-10-CM

## 2023-05-29 ENCOUNTER — Ambulatory Visit (INDEPENDENT_AMBULATORY_CARE_PROVIDER_SITE_OTHER): Payer: Medicare Other

## 2023-05-29 ENCOUNTER — Ambulatory Visit (INDEPENDENT_AMBULATORY_CARE_PROVIDER_SITE_OTHER): Payer: Medicare Other | Admitting: Internal Medicine

## 2023-05-29 ENCOUNTER — Other Ambulatory Visit: Payer: Self-pay | Admitting: Acute Care

## 2023-05-29 ENCOUNTER — Encounter: Payer: Self-pay | Admitting: Internal Medicine

## 2023-05-29 VITALS — BP 120/68 | HR 72 | Ht 66.0 in | Wt 165.0 lb

## 2023-05-29 DIAGNOSIS — E119 Type 2 diabetes mellitus without complications: Secondary | ICD-10-CM | POA: Diagnosis not present

## 2023-05-29 DIAGNOSIS — Z0000 Encounter for general adult medical examination without abnormal findings: Secondary | ICD-10-CM

## 2023-05-29 DIAGNOSIS — Z7984 Long term (current) use of oral hypoglycemic drugs: Secondary | ICD-10-CM

## 2023-05-29 DIAGNOSIS — G2581 Restless legs syndrome: Secondary | ICD-10-CM

## 2023-05-29 DIAGNOSIS — Z87891 Personal history of nicotine dependence: Secondary | ICD-10-CM

## 2023-05-29 DIAGNOSIS — Z122 Encounter for screening for malignant neoplasm of respiratory organs: Secondary | ICD-10-CM

## 2023-05-29 DIAGNOSIS — F17210 Nicotine dependence, cigarettes, uncomplicated: Secondary | ICD-10-CM

## 2023-05-29 LAB — POCT GLYCOSYLATED HEMOGLOBIN (HGB A1C): Hemoglobin A1C: 7.2 % — AB (ref 4.0–5.6)

## 2023-05-29 MED ORDER — ROPINIROLE HCL 0.25 MG PO TABS: 0.2500 mg | ORAL_TABLET | Freq: Every evening | ORAL | 0 refills | Status: DC | PRN

## 2023-05-29 NOTE — Assessment & Plan Note (Addendum)
Blood sugars stable without hypoglycemic symptoms or events. Currently being treated with metformin and amaryl. Lab Results  Component Value Date   HGBA1C 7.3 (H) 01/20/2023  A1C 7.2 today - will continue current regimen.

## 2023-05-29 NOTE — Patient Instructions (Signed)
Schedule your Diabetic eye exam ?

## 2023-05-29 NOTE — Progress Notes (Signed)
Date:  05/29/2023   Name:  Suzanne Richardson   DOB:  1951-11-17   MRN:  [id number]   Chief Complaint: Diabetes  Diabetes She presents for her follow-up diabetic visit. She has type 2 diabetes mellitus. Her disease course has been stable. Pertinent negatives for hypoglycemia include no headaches, nervousness/anxiousness or tremors. Pertinent negatives for diabetes include no chest pain, no fatigue, no polydipsia and no polyuria. There are no hypoglycemic complications. Her breakfast blood glucose is taken between 6-7 am. Her breakfast blood glucose range is generally 110-130 mg/dl.   RLS - she has been having RLS symptoms for some time.  Not every night but on bad nights she can barely sleep. Onset is usually right after retiring for the night, never before hand. Walking helps but only temporarily.  She has never taken medications for this.   Lab Results  Component Value Date   NA 140 01/20/2023   K 3.7 01/20/2023   CO2 21 01/20/2023   GLUCOSE 128 (H) 01/20/2023   BUN 10 01/20/2023   CREATININE 0.67 01/20/2023   CALCIUM 9.6 01/20/2023   EGFR 92 01/20/2023   GFRNONAA 93 02/05/2021   Lab Results  Component Value Date   CHOL 155 01/20/2023   HDL 61 01/20/2023   LDLCALC 73 01/20/2023   TRIG 121 01/20/2023   CHOLHDL 2.5 01/20/2023   Lab Results  Component Value Date   TSH 1.180 02/05/2021   Lab Results  Component Value Date   HGBA1C 7.2 (A) 05/29/2023   Lab Results  Component Value Date   WBC 10.4 01/20/2023   HGB 13.6 01/20/2023   HCT 39.4 01/20/2023   MCV 91 01/20/2023   PLT 304 01/20/2023   Lab Results  Component Value Date   ALT 18 01/20/2023   AST 14 01/20/2023   ALKPHOS 85 01/20/2023   BILITOT 0.2 01/20/2023   No results found for: "25OHVITD2", "25OHVITD3", "VD25OH"   Review of Systems  Constitutional:  Negative for appetite change, fatigue, fever and unexpected weight change.  HENT:  Negative for tinnitus and trouble swallowing.   Eyes:  Negative for  visual disturbance.  Respiratory:  Negative for cough, chest tightness and shortness of breath.   Cardiovascular:  Negative for chest pain, palpitations and leg swelling.  Gastrointestinal:  Negative for abdominal pain.  Endocrine: Negative for polydipsia and polyuria.  Genitourinary:  Negative for dysuria and hematuria.  Musculoskeletal:  Positive for myalgias (RLS symptoms). Negative for arthralgias.  Neurological:  Negative for tremors, numbness and headaches.  Psychiatric/Behavioral:  Positive for sleep disturbance. Negative for dysphoric mood. The patient is not nervous/anxious.     Patient Active Problem List   Diagnosis Date Noted   Restless leg syndrome 05/29/2023   Centrilobular emphysema (HCC) 09/13/2021   Pulmonary nodule 1 cm or greater in diameter 09/13/2021   Aortic atherosclerosis (HCC) 07/10/2021   Hyperlipidemia associated with type 2 diabetes mellitus (HCC) 05/08/2021   Diabetes mellitus treated with oral medication (HCC) 02/05/2021   Tobacco use disorder 02/05/2021    No Known Allergies  Past Surgical History:  Procedure Laterality Date   CATARACT EXTRACTION W/PHACO Left 07/15/2019   Procedure: CATARACT EXTRACTION PHACO AND INTRAOCULAR LENS PLACEMENT ([facility]) LEFT;  Surgeon: Nevada Crane, MD;  [facility];  Service: Ophthalmology;  Laterality: Left;   CATARACT EXTRACTION W/PHACO Right 08/08/2019   Procedure: CATARACT EXTRACTION PHACO AND INTRAOCULAR LENS PLACEMENT ([facility]) RIGHT;  Surgeon: Nevada Crane, MD;  [facility];  Service: Ophthalmology;  Laterality: Right;    Social History   Tobacco Use   Smoking status: Every Day    Packs/day: 0.80    Years: 60.00    Additional pack years: 0.00    Total pack years: 48.00    Types: Cigarettes   Smokeless tobacco: Never   Tobacco comments:    Cut back to < 1 ppd  Vaping Use   Vaping Use: Never used  Substance Use Topics   Alcohol use: Not Currently   Drug use: Never      Medication list has been reviewed and updated.  Current Meds  Medication Sig   atorvastatin (LIPITOR) 10 MG tablet Take 1 tablet by mouth once daily   buPROPion (WELLBUTRIN SR) 150 MG 12 hr tablet Take 1 tablet by mouth twice daily   glimepiride (AMARYL) 2 MG tablet TAKE 1 TABLET BY MOUTH ONCE DAILY BEFORE BREAKFAST   metFORMIN (GLUCOPHAGE-XR) 500 MG 24 hr tablet TAKE 2 TABLETS BY MOUTH DAILY WITH BREAKFAST AND 1 TABLET DAILY AFTER SUPPER   Multiple Vitamin (MULTIVITAMIN) tablet Take 1 tablet by mouth daily.   rOPINIRole (REQUIP) 0.25 MG tablet Take 1 tablet (0.25 mg total) by mouth at bedtime as needed.       05/29/2023    9:20 AM 01/20/2023    9:39 AM 09/15/2022    9:56 AM 05/15/2022    9:17 AM  GAD 7 : Generalized Anxiety Score  Nervous, Anxious, on Edge 0 0 0 0  Control/stop worrying 0 0 0 0  Worry too much - different things 0 0 0 0  Trouble relaxing 0 0 0 0  Restless 0 0 0 0  Easily annoyed or irritable 0 0 0 0  Afraid - awful might happen 0 0 0 0  Total GAD 7 Score 0 0 0 0  Anxiety Difficulty Not difficult at all Not difficult at all Not difficult at all Not difficult at all       05/29/2023    9:20 AM 01/20/2023    9:39 AM 09/15/2022    9:56 AM  Depression screen PHQ 2/9  Decreased Interest 0 1 0  Down, Depressed, Hopeless 0 0 0  PHQ - 2 Score 0 1 0  Altered sleeping 1 2 0  Tired, decreased energy 2 0 1  Change in appetite 0 0 1  Feeling bad or failure about yourself  0 0 0  Trouble concentrating 0 0 0  Moving slowly or fidgety/restless 0 0 0  Suicidal thoughts 0 0 0  PHQ-9 Score 3 3 2   Difficult doing work/chores Not difficult at all Not difficult at all Not difficult at all    BP Readings from Last 3 Encounters:  05/29/23 120/68  01/20/23 124/76  09/15/22 118/72    Physical Exam Vitals and nursing note reviewed.  Constitutional:      General: She is not in acute distress.    Appearance: She is well-developed.  HENT:     Head: Normocephalic and  atraumatic.  Neck:     Vascular: No carotid bruit.  Cardiovascular:     Rate and Rhythm: Normal rate and regular rhythm.     Pulses: Normal pulses.  Pulmonary:     Effort: Pulmonary effort is normal. No respiratory distress.     Breath sounds: No wheezing or rhonchi.  Musculoskeletal:     Cervical back: Normal range of motion.  Lymphadenopathy:     Cervical: No cervical adenopathy.  Skin:    General: Skin is warm  and dry.     Findings: No rash.  Neurological:     General: No focal deficit present.     Mental Status: She is alert and oriented to person, place, and time.     Motor: No weakness.  Psychiatric:        Mood and Affect: Mood normal.        Behavior: Behavior normal.     Wt Readings from Last 3 Encounters:  05/29/23 165 lb (74.8 kg)  01/20/23 165 lb (74.8 kg)  09/15/22 164 lb (74.4 kg)    BP 120/68   Pulse 72   Ht 5\' 6"  (1.676 m)   Wt 165 lb (74.8 kg)   SpO2 95%   BMI 26.63 kg/m   Assessment and Plan:  Problem List Items Addressed This Visit     Restless leg syndrome   Relevant Medications   rOPINIRole (REQUIP) 0.25 MG tablet   Diabetes mellitus treated with oral medication (HCC) - Primary    Blood sugars stable without hypoglycemic symptoms or events. Currently being treated with metformin and amaryl. Lab Results  Component Value Date   HGBA1C 7.3 (H) 01/20/2023  A1C 7.2 today - will continue current regimen.       Relevant Orders   POCT glycosylated hemoglobin (Hb A1C) (Completed)    Return in about 4 months (around 09/28/2023) for DM.  MAW completed today by CMA. Partially dictated using Dragon software, any errors are not intentional.  Reubin Milan, MD [facility] [place], [place]

## 2023-05-29 NOTE — Progress Notes (Addendum)
Subjective:   Suzanne Richardson is a 72 y.o. female who presents for Medicare Annual (Subsequent) preventive examination.  I connected with  Suzanne Richardson on 05/29/23 by an in person visit and verified that I am speaking with the correct person using two identifiers.  Patient Location: Other:  In Office  Provider Location: Office/Clinic   Review of Systems    Defer to PCP  Cardiac Risk Factors include: advanced age (>96men, >63 women);diabetes mellitus     Objective:    Today's Vitals   05/29/23 0926  PainSc: 0-No pain   There is no height or weight on file to calculate BMI.     05/29/2023    9:27 AM 05/05/2022   10:21 AM 08/08/2019   10:32 AM 07/15/2019    9:40 AM  Advanced Directives  Does Patient Have a Medical Advance Directive? No No No No  Would patient like information on creating a medical advance directive? No - Patient declined Yes (MAU/Ambulatory/Procedural Areas - Information given) No - Patient declined No - Patient declined    Current Medications (verified) Outpatient Encounter Medications as of 05/29/2023  Medication Sig   atorvastatin (LIPITOR) 10 MG tablet Take 1 tablet by mouth once daily   buPROPion (WELLBUTRIN SR) 150 MG 12 hr tablet Take 1 tablet by mouth twice daily   glimepiride (AMARYL) 2 MG tablet TAKE 1 TABLET BY MOUTH ONCE DAILY BEFORE BREAKFAST   metFORMIN (GLUCOPHAGE-XR) 500 MG 24 hr tablet TAKE 2 TABLETS BY MOUTH DAILY WITH BREAKFAST AND 1 TABLET DAILY AFTER SUPPER   Multiple Vitamin (MULTIVITAMIN) tablet Take 1 tablet by mouth daily.   No facility-administered encounter medications on file as of 05/29/2023.    Allergies (verified) Patient has no known allergies.   History: Past Medical History:  Diagnosis Date   Depression    Hyperlipidemia    Hypertension    Past Surgical History:  Procedure Laterality Date   CATARACT EXTRACTION W/PHACO Left 07/15/2019   Procedure: CATARACT EXTRACTION PHACO AND INTRAOCULAR LENS PLACEMENT (IOC)  LEFT;  Surgeon: Nevada Crane, MD;  Location: [facility];  Service: Ophthalmology;  Laterality: Left;   CATARACT EXTRACTION W/PHACO Right 08/08/2019   Procedure: CATARACT EXTRACTION PHACO AND INTRAOCULAR LENS PLACEMENT (IOC) RIGHT;  Surgeon: Nevada Crane, MD;  Location: [facility];  Service: Ophthalmology;  Laterality: Right;   Family History  Problem Relation Age of Onset   Parkinson's disease Mother    Lung cancer Father    Social History   Socioeconomic History   Marital status: Married    Spouse name: Suzanne Richardson   Number of children: 2   Years of education: Not on file   Highest education level: Not on file  Occupational History   Not on file  Tobacco Use   Smoking status: Every Day    Packs/day: 0.80    Years: 60.00    Additional pack years: 0.00    Total pack years: 48.00    Types: Cigarettes   Smokeless tobacco: Never   Tobacco comments:    Cut back to < 1 ppd  Vaping Use   Vaping Use: Never used  Substance and Sexual Activity   Alcohol use: Not Currently   Drug use: Never   Sexual activity: Yes    Birth control/protection: None  Other Topics Concern   Not on file  Social History Narrative   Not on file   Social Determinants of Health   Financial Resource Strain: Low Risk  (05/29/2023)  Overall Financial Resource Strain (CARDIA)    Difficulty of Paying Living Expenses: Not hard at all  Food Insecurity: No Food Insecurity (05/29/2023)   Hunger Vital Sign    Worried About Running Out of Food in the Last Year: Never true    Ran Out of Food in the Last Year: Never true  Transportation Needs: No Transportation Needs (01/20/2023)   PRAPARE - Administrator, Civil Service (Medical): No    Lack of Transportation (Non-Medical): No  Physical Activity: Inactive (05/29/2023)   Exercise Vital Sign    Days of Exercise per Week: 0 days    Minutes of Exercise per Session: 0 min  Stress: No Stress Concern Present (05/29/2023)    [employer] of Occupational Health - Occupational Stress Questionnaire    Feeling of Stress : Not at all  Social Connections: Moderately Isolated (05/05/2022)   Social Connection and Isolation Panel [NHANES]    Frequency of Communication with Friends and Family: More than three times a week    Frequency of Social Gatherings with Friends and Family: Once a week    Attends Religious Services: Never    Database administrator or Organizations: No    Attends Banker Meetings: Never    Marital Status: Married    Tobacco Counseling Patient not ready to quit. Patient smokes almost a full pack a day.   Clinical Intake:  Pre-visit preparation completed: Yes  Pain : No/denies pain Pain Score: 0-No pain     BMI - recorded: 26.63 Nutritional Status: BMI 25 -29 Overweight Nutritional Risks: None Diabetes: Yes  How often do you need to have someone help you when you read instructions, pamphlets, or other written materials from your doctor or pharmacy?: 1 - Never  Diabetic? Yes  Interpreter Needed?: No  Information entered by :: Margaretha Sheffield, CMA   Activities of Daily Living    05/29/2023    9:27 AM  In your present state of health, do you have any difficulty performing the following activities:  Hearing? 0  Vision? 0  Difficulty concentrating or making decisions? 0  Walking or climbing stairs? 0  Dressing or bathing? 0  Doing errands, shopping? 0  Preparing Food and eating ? N  Using the Toilet? N  In the past six months, have you accidently leaked urine? N  Do you have problems with loss of bowel control? N  Managing your Medications? N  Managing your Finances? N  Housekeeping or managing your Housekeeping? N    Patient Care Team: Reubin Milan, MD as PCP - General (Internal Medicine)  Indicate any recent Medical Services you may have received from other than [facility] providers in the past year (date may be approximate).     Assessment:   This is  a routine wellness examination for Suzanne Richardson.  Hearing/Vision screen No concerns at this time.  Dietary issues and exercise activities discussed: Current Exercise Habits: The patient does not participate in regular exercise at present, Exercise limited by: None identified   Goals Addressed   None   Depression Screen    05/29/2023    9:20 AM 01/20/2023    9:39 AM 09/15/2022    9:56 AM 05/15/2022    9:17 AM 05/15/2022    9:14 AM 05/05/2022   10:20 AM 01/13/2022    8:12 AM  PHQ 2/9 Scores  PHQ - 2 Score 0 1 0 0 0 0 0  PHQ- 9 Score 3 3 2 2 2   0  Fall Risk    05/29/2023    9:20 AM 01/20/2023    9:39 AM 09/15/2022    9:56 AM 05/15/2022    9:17 AM 05/15/2022    9:14 AM  Fall Risk   Falls in the past year? 0 0 0 0 0  Number falls in past yr: 0 0 0 0 0  Injury with Fall? 0 0 0 0 0  Risk for fall due to : No Fall Risks No Fall Risks No Fall Risks No Fall Risks No Fall Risks  Follow up Falls evaluation completed Falls evaluation completed Falls evaluation completed Falls evaluation completed Falls evaluation completed    FALL RISK PREVENTION PERTAINING TO THE HOME:  Any stairs in or around the home? Yes  If so, are there any without handrails? No  Home free of loose throw rugs in walkways, pet beds, electrical cords, etc? Yes  Adequate lighting in your home to reduce risk of falls? Yes   ASSISTIVE DEVICES UTILIZED TO PREVENT FALLS:  Life alert? No  Use of a cane, walker or w/c? No   TIMED UP AND GO:  Was the test performed? Yes .  Gait steady and fast without use of assistive device  Immunizations  TDAP status: Due, Education has been provided regarding the importance of this vaccine. Advised may receive this vaccine at local pharmacy or Health Dept. Aware to provide a copy of the vaccination record if obtained from local pharmacy or Health Dept. Verbalized acceptance and understanding.  Flu Vaccine status: Due, Education has been provided regarding the importance of this  vaccine. Advised may receive this vaccine at local pharmacy or Health Dept. Aware to provide a copy of the vaccination record if obtained from local pharmacy or Health Dept. Verbalized acceptance and understanding.  Pneumococcal vaccine status: Due, Education has been provided regarding the importance of this vaccine. Advised may receive this vaccine at local pharmacy or Health Dept. Aware to provide a copy of the vaccination record if obtained from local pharmacy or Health Dept. Verbalized acceptance and understanding.  Covid-19 vaccine status: Declined, Education has been provided regarding the importance of this vaccine but patient still declined. Advised may receive this vaccine at local pharmacy or Health Dept.or vaccine clinic. Aware to provide a copy of the vaccination record if obtained from local pharmacy or Health Dept. Verbalized acceptance and understanding.  Qualifies for Shingles Vaccine? Yes   Zostavax completed No   Shingrix Completed?: No.    Education has been provided regarding the importance of this vaccine. Patient has been advised to call insurance company to determine out of pocket expense if they have not yet received this vaccine. Advised may also receive vaccine at local pharmacy or Health Dept. Verbalized acceptance and understanding.  Screening Tests Health Maintenance  Topic Date Due   Pneumonia Vaccine 58+ Years old (1 of 2 - PCV) Never done   DTaP/Tdap/Td (1 - Tdap) Never done   Colonoscopy  Never done   MAMMOGRAM  Never done   DEXA SCAN  Never done   OPHTHALMOLOGY EXAM  04/24/2023   Lung Cancer Screening  07/03/2023   INFLUENZA VACCINE  07/30/2023   HEMOGLOBIN A1C  11/28/2023   Diabetic kidney evaluation - eGFR measurement  01/21/2024   Diabetic kidney evaluation - Urine ACR  01/21/2024   FOOT EXAM  01/21/2024   Medicare Annual Wellness (AWV)  05/28/2024   Hepatitis C Screening  Completed   HPV VACCINES  Aged Out   COVID-19 Vaccine  Discontinued  Zoster  Vaccines- Shingrix  Discontinued    Health Maintenance  Health Maintenance Due  Topic Date Due   Pneumonia Vaccine 42+ Years old (1 of 2 - PCV) Never done   DTaP/Tdap/Td (1 - Tdap) Never done   Colonoscopy  Never done   MAMMOGRAM  Never done   DEXA SCAN  Never done   OPHTHALMOLOGY EXAM  04/24/2023    Patient declines to have a mammogram, colonoscopy, and DEXA.  Lung Cancer Screening: (Low Dose CT Chest recommended if Age 46-80 years, 30 pack-year currently smoking OR have quit w/in 15years.) does qualify.   Additional Screening:  Hepatitis C Screening: does not qualify  Vision Screening: Recommended annual ophthalmology exams for early detection of glaucoma and other disorders of the eye. Is the patient up to date with their annual eye exam?  No   Dental Screening: Recommended annual dental exams for proper oral hygiene  Community Resource Referral / Chronic Care Management: CRR required this visit?  No   CCM required this visit?  No      Plan:     I have personally reviewed and noted the following in the patient's chart:   Medical and social history Use of alcohol, tobacco or illicit drugs  Current medications and supplements including opioid prescriptions. Patient is not currently taking opioid prescriptions. Functional ability and status Nutritional status Physical activity Advanced directives List of other physicians Hospitalizations, surgeries, and ER visits in previous 12 months Vitals Screenings to include cognitive, depression, and falls Referrals and appointments  In addition, I have reviewed and discussed with patient certain preventive protocols, quality metrics, and best practice recommendations. A written personalized care plan for preventive services as well as general preventive health recommendations were provided to patient.     Mariel Sleet, CMA   05/29/2023   Nurse Notes: None.

## 2023-06-20 ENCOUNTER — Other Ambulatory Visit: Payer: Self-pay | Admitting: Internal Medicine

## 2023-06-20 DIAGNOSIS — F17200 Nicotine dependence, unspecified, uncomplicated: Secondary | ICD-10-CM

## 2023-06-26 ENCOUNTER — Other Ambulatory Visit: Payer: Self-pay | Admitting: Internal Medicine

## 2023-06-26 DIAGNOSIS — G2581 Restless legs syndrome: Secondary | ICD-10-CM

## 2023-07-07 ENCOUNTER — Ambulatory Visit
Admission: RE | Admit: 2023-07-07 | Discharge: 2023-07-07 | Disposition: A | Discharge status: Home / Self Care | Payer: Medicare Other | Source: Ambulatory Visit | Attending: Internal Medicine | Admitting: Internal Medicine

## 2023-07-07 DIAGNOSIS — I700 Atherosclerosis of aorta: Secondary | ICD-10-CM | POA: Insufficient documentation

## 2023-07-07 DIAGNOSIS — F17210 Nicotine dependence, cigarettes, uncomplicated: Secondary | ICD-10-CM | POA: Diagnosis not present

## 2023-07-07 DIAGNOSIS — J432 Centrilobular emphysema: Secondary | ICD-10-CM | POA: Insufficient documentation

## 2023-07-07 DIAGNOSIS — Z122 Encounter for screening for malignant neoplasm of respiratory organs: Secondary | ICD-10-CM

## 2023-07-07 DIAGNOSIS — Z87891 Personal history of nicotine dependence: Secondary | ICD-10-CM

## 2023-07-07 DIAGNOSIS — I2510 Atherosclerotic heart disease of native coronary artery without angina pectoris: Secondary | ICD-10-CM | POA: Insufficient documentation

## 2023-07-10 ENCOUNTER — Telehealth: Payer: Self-pay | Admitting: Acute Care

## 2023-07-10 NOTE — Telephone Encounter (Signed)
Call Report  

## 2023-07-10 NOTE — Telephone Encounter (Signed)
Call report received and noted in [facility] dashboard IMPRESSION: 1. Large irregular appearing cystic lesion in the right upper lobe similar to prior studies, however, there is a area of nodular thickening along the posteromedial wall on today's study which has increased compared to prior examinations. This may simply represent an area of chronic postinfectious scarring, however, the possibility of a neoplasm such as a slow growing primary bronchogenic adenocarcinoma is not excluded, and accordingly, today's study is categorized as Lung-RADS 4BS, suspicious. Additional imaging evaluation or consultation with Pulmonology or Thoracic Surgery recommended. 2. The "S" modifier above refers to potentially clinically significant non lung cancer related findings. Specifically, there is aortic atherosclerosis, in addition to left main and 2 vessel coronary artery disease. Please note that although the presence of coronary artery calcium documents the presence of coronary artery disease, the severity of this disease and any potential stenosis cannot be assessed on this non-gated CT examination. Assessment for potential risk factor modification, dietary therapy or pharmacologic therapy may be warranted, if clinically indicated. 3. Mild diffuse bronchial wall thickening with moderate centrilobular and paraseptal emphysema; imaging findings suggestive of underlying COPD.   These results will be called to the ordering clinician or representative by the Radiologist Assistant, and communication documented in the PACS or [employer].   Aortic Atherosclerosis (ICD10-I70.0) and Emphysema (ICD10-J43.9).     Electronically Signed   By: Brayton Mars.D.

## 2023-07-15 ENCOUNTER — Telehealth: Payer: Self-pay | Admitting: Acute Care

## 2023-07-15 NOTE — Telephone Encounter (Signed)
I have called the patient with the results of her low dose Ct Chest. I explained that her scan was read as a LR 4 B. She has a  Large irregular appearing cystic lesion in the right upper lobe similar to prior studies, however, there is a area of nodular thickening along the posteromedial wall on today's study which has increased compared to prior examinations. This may simply represent an area of chronic postinfectious scarring, however, the possibility of a neoplasm such as a slow growing primary bronchogenic adenocarcinoma is not excluded. I have reviewed the results of the scan with Dr. Jayme Cloud, and she feels if this nodule  were to be biopsied, it would definitely result in a pneumothorax because of this huge cystic lesion. Plan is for follow up with Dr. Jayme Cloud, and 3 month follow up low dose Ct Chest.   Sherre Lain, and Robynn Pane, please  order 3 month follow up low dose Ct Chest, and fax results to PCP. Delray Alt and Foye Clock, please schedule patient with Dr. Jayme Cloud for follow up of LR 4 B scan. Dr. Reece Agar, will you please refer to Cardiology in [facility]  when you see her!! Thanks  Pt. Is in agreement with above plan and understands she will get a call from the [place] office to schedule appointment with Dr. Reece Agar. Thanks so much everyone

## 2023-07-16 ENCOUNTER — Other Ambulatory Visit: Payer: Self-pay

## 2023-07-16 DIAGNOSIS — R911 Solitary pulmonary nodule: Secondary | ICD-10-CM

## 2023-07-16 NOTE — Telephone Encounter (Signed)
Results/plan faxed to PCP.  New order placed for 3 months follow up LDCT for nodule

## 2023-07-16 NOTE — Telephone Encounter (Signed)
Spoke to patient and scheduled appt for 09/01/2023 at 10:00. Directions given.  Nothing further needed.

## 2023-07-21 ENCOUNTER — Encounter: Payer: Self-pay | Admitting: Internal Medicine

## 2023-07-30 ENCOUNTER — Other Ambulatory Visit: Payer: Self-pay | Admitting: Internal Medicine

## 2023-07-30 DIAGNOSIS — E118 Type 2 diabetes mellitus with unspecified complications: Secondary | ICD-10-CM

## 2023-07-30 NOTE — Telephone Encounter (Signed)
Requested Prescriptions  Pending Prescriptions Disp Refills   metFORMIN (GLUCOPHAGE-XR) 500 MG 24 hr tablet [Pharmacy Med Name: metFORMIN HCl ER 500 MG Oral Tablet Extended Release 24 Hour] 270 tablet 1    Sig: TAKE 2 TABLETS BY MOUTH DAILY WITH BREAKFAST AND 1 TABLET BY MOUTH AFTER SUPPER     Endocrinology:  Diabetes - Biguanides Failed - 07/30/2023  9:18 AM      Failed - B12 Level in normal range and within 720 days    No results found for: "VITAMINB12"       Passed - Cr in normal range and within 360 days    Creatinine, Ser  Date Value Ref Range Status  01/20/2023 0.67 0.57 - 1.00 mg/dL Final         Passed - HBA1C is between 0 and 7.9 and within 180 days    Hemoglobin A1C  Date Value Ref Range Status  05/29/2023 7.2 (A) 4.0 - 5.6 % Final   Hgb A1c MFr Bld  Date Value Ref Range Status  01/20/2023 7.3 (H) 4.8 - 5.6 % Final    Comment:             Prediabetes: 5.7 - 6.4          Diabetes: >6.4          Glycemic control for adults with diabetes: <7.0          Passed - eGFR in normal range and within 360 days    GFR calc Af Amer  Date Value Ref Range Status  02/05/2021 107 >59 mL/min/1.73 Final    Comment:    **In accordance with recommendations from the NKF-ASN Task force,**   [facility] is in the process of updating its eGFR calculation to the   2021 CKD-EPI creatinine equation that estimates kidney function   without a race variable.    GFR calc non Af Amer  Date Value Ref Range Status  02/05/2021 93 >59 mL/min/1.73 Final   eGFR  Date Value Ref Range Status  01/20/2023 92 >59 mL/min/1.73 Final         Passed - Valid encounter within last 6 months    Recent Outpatient Visits           2 months ago Diabetes mellitus treated with oral medication (HCC)   [place] [facility] at [facility], Nyoka Cowden, MD   6 months ago Type II diabetes mellitus with complication [facility])   [place] [facility] at [facility], Nyoka Cowden, MD   10 months ago Type II diabetes mellitus with complication [facility])   [place] [facility] at [facility], Nyoka Cowden, MD   1 year ago Type II diabetes mellitus with complication [facility])   [place] [facility] at [facility], Nyoka Cowden, MD   1 year ago Type II diabetes mellitus with complication [facility])   [place] [facility] at [facility], Nyoka Cowden, MD       Future Appointments             In 2 months Judithann Graves Nyoka Cowden, MD [facility] at [facility], [facility]            Passed - CBC within normal limits and completed in the last 12 months    WBC  Date Value Ref Range Status  01/20/2023 10.4 3.4 -  10.8 x10E3/uL Final   RBC  Date Value Ref Range Status  01/20/2023 4.33 3.77 - 5.28 x10E6/uL Final   Hemoglobin  Date Value Ref Range Status  01/20/2023 13.6 11.1 - 15.9 g/dL Final   Hematocrit  Date Value Ref Range Status  01/20/2023 39.4 34.0 - 46.6 % Final   MCHC  Date Value Ref Range Status  01/20/2023 34.5 31.5 - 35.7 g/dL Final   [facility]  Date Value Ref Range Status  01/20/2023 31.4 26.6 - 33.0 pg Final   MCV  Date Value Ref Range Status  01/20/2023 91 79 - 97 fL Final   No results found for: "PLTCOUNTKUC", "LABPLAT", "POCPLA" RDW  Date Value Ref Range Status  01/20/2023 12.6 11.7 - 15.4 % Final

## 2023-08-22 ENCOUNTER — Other Ambulatory Visit: Payer: Self-pay | Admitting: Internal Medicine

## 2023-08-22 DIAGNOSIS — E118 Type 2 diabetes mellitus with unspecified complications: Secondary | ICD-10-CM

## 2023-09-01 ENCOUNTER — Encounter: Payer: Self-pay | Admitting: Pulmonary Disease

## 2023-09-01 ENCOUNTER — Ambulatory Visit (INDEPENDENT_AMBULATORY_CARE_PROVIDER_SITE_OTHER): Payer: Medicare Other | Admitting: Pulmonary Disease

## 2023-09-01 VITALS — BP 132/80 | HR 94 | Temp 97.8°F | Ht 66.0 in | Wt 166.8 lb

## 2023-09-01 DIAGNOSIS — J449 Chronic obstructive pulmonary disease, unspecified: Secondary | ICD-10-CM

## 2023-09-01 DIAGNOSIS — I2510 Atherosclerotic heart disease of native coronary artery without angina pectoris: Secondary | ICD-10-CM

## 2023-09-01 DIAGNOSIS — I2584 Coronary atherosclerosis due to calcified coronary lesion: Secondary | ICD-10-CM

## 2023-09-01 DIAGNOSIS — Z87891 Personal history of nicotine dependence: Secondary | ICD-10-CM | POA: Diagnosis not present

## 2023-09-01 DIAGNOSIS — R911 Solitary pulmonary nodule: Secondary | ICD-10-CM | POA: Diagnosis not present

## 2023-09-01 NOTE — Patient Instructions (Addendum)
Please discuss with Dr. Marge Duncans primary care, with regards to the calcium noted on your heart arteries as you may need further evaluation of this.  We are going to schedule breathing test to determine the severity of the emphysema in your lungs.  We will see you in follow-up towards the end of October follow-up on the findings on your CT.

## 2023-09-01 NOTE — Progress Notes (Signed)
Subjective:    Patient ID: Suzanne Richardson, female    DOB: 11-15-1951, 72 y.o.   MRN: [id number]  Patient Care Team: Reubin Milan, MD as PCP - General (Internal Medicine)  Chief Complaint  Patient presents with   Consult    Nodule. Occasional SOB. No wheezing or cough.    HPI Suzanne Richardson is a 72 year old recent former smoker (quit 3 weeks ago/60 PY) who presents for evaluation of abnormal lung cancer screening CT.  She is kindly referred by Bari Edward, MD.  The patient has been asymptomatic with regards to these findings.  She had a lung cancer screening CT on 07 July 2023 that showed diffuse bronchial wall thickening and moderate centrilobular and paraseptal emphysema as well as a cystic area on the right upper lobe with one of the edges having developed nodular thickening averaging 9.9 mm.  The area in question is not amenable for biopsy at present due to the nature of the lesion.  There is also diffuse bronchial wall thickening with moderate centrilobular and paraseptal emphysema consistent with COPD.  Patient was also noted to have coronary artery calcifications.  She has not had any worsening of shortness of breath.  She has noted some shortness of breath on exertion for a number of years that she attributed to "age" and her smoking.  She has not been overly bothered by this.  No fevers, chills or sweats.  No cough or sputum production.  No hemoptysis.  She has no wheezing.  No orthopnea, paroxysmal nocturnal dyspnea, lower extremity edema or calf tenderness.  No weight loss or anorexia.  Patient has no military history.  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  She smoked 1 pack of cigarettes per day for 60 years, quit 3 weeks ago.   Review of Systems A 10 point review of systems was performed and it is as noted above otherwise negative.   Past Medical History:  Diagnosis Date   Depression    Hyperlipidemia    Hypertension     Past Surgical History:  Procedure  Laterality Date   CATARACT EXTRACTION W/PHACO Left 07/15/2019   Procedure: CATARACT EXTRACTION PHACO AND INTRAOCULAR LENS PLACEMENT (IOC) LEFT;  Surgeon: Nevada Crane, MD;  Location: [facility];  Service: Ophthalmology;  Laterality: Left;   CATARACT EXTRACTION W/PHACO Right 08/08/2019   Procedure: CATARACT EXTRACTION PHACO AND INTRAOCULAR LENS PLACEMENT (IOC) RIGHT;  Surgeon: Nevada Crane, MD;  Location: [facility];  Service: Ophthalmology;  Laterality: Right;    Patient Active Problem List   Diagnosis Date Noted   Restless leg syndrome 05/29/2023   Centrilobular emphysema (HCC) 09/13/2021   Pulmonary nodule 1 cm or greater in diameter 09/13/2021   Aortic atherosclerosis (HCC) 07/10/2021   Hyperlipidemia associated with type 2 diabetes mellitus (HCC) 05/08/2021   Diabetes mellitus treated with oral medication (HCC) 02/05/2021   Tobacco use disorder 02/05/2021    Family History  Problem Relation Age of Onset   Parkinson's disease Mother    Lung cancer Father     Social History   Tobacco Use   Smoking status: Former    Current packs/day: 0.80    Average packs/day: 0.8 packs/day for 60.0 years (48.0 ttl pk-yrs)    Types: Cigarettes   Smokeless tobacco: Never   Tobacco comments:    Quit in 08/06/2023. Smoked 1PPD for 60 years.  Substance Use Topics   Alcohol use: Not Currently    No Known Allergies  Current Meds  Medication  Sig   atorvastatin (LIPITOR) 10 MG tablet Take 1 tablet by mouth once daily   buPROPion (WELLBUTRIN SR) 150 MG 12 hr tablet Take 1 tablet by mouth twice daily   glimepiride (AMARYL) 2 MG tablet TAKE 1 TABLET BY MOUTH ONCE DAILY BEFORE BREAKFAST   metFORMIN (GLUCOPHAGE-XR) 500 MG 24 hr tablet TAKE 2 TABLETS BY MOUTH DAILY WITH BREAKFAST AND 1 TABLET BY MOUTH AFTER SUPPER   Multiple Vitamin (MULTIVITAMIN) tablet Take 1 tablet by mouth daily.   rOPINIRole (REQUIP) 0.25 MG tablet TAKE 1 TABLET BY MOUTH AT BEDTIME AS NEEDED      There is no immunization history on file for this patient.      Objective:     BP 132/80 (BP Location: Right Arm, Cuff Size: Normal)   Pulse 94   Temp 97.8 F (36.6 C)   Ht 5\' 6"  (1.676 m)   Wt 166 lb 12.8 oz (75.7 kg)   SpO2 96%   BMI 26.92 kg/m   SpO2: 96 % O2 Device: None (Room air)  GENERAL: Well-developed, overweight woman, no acute distress.  No conversational dyspnea. HEAD: Normocephalic, atraumatic.  EYES: Pupils equal, round, reactive to light.  No scleral icterus.  MOUTH: Dentition intact, oral mucosa moist. NECK: Supple. No thyromegaly. Trachea midline. No JVD.  No adenopathy. PULMONARY: Good air entry bilaterally.  No adventitious sounds. CARDIOVASCULAR: S1 and S2. Regular rate and rhythm.  No rubs, murmurs or gallops heard. ABDOMEN: Benign. MUSCULOSKELETAL: No joint deformity, no clubbing, no edema.  NEUROLOGIC: No overt focal deficit, no gait disturbance noted, speech is fluent. SKIN: Intact,warm,dry. PSYCH: Mood and behavior normal.  Imaging was independently reviewed and reviewed with the patient as well.   Representative images from CT performed 07 July 2023 showing the right upper lobe cystic lesion with area of thickening along 1 of its borders (arrow):       Assessment & Plan:     ICD-10-CM   1. COPD suggested by initial evaluation [facility])  J44.9 Pulmonary Function Test [facility] Only   Will assess with PFTs Currently on no maintenance inhalers    2. Lung nodule seen on imaging study  R91.1    Patient has follow-up imaging 9 October Reassess after imaging    3. Coronary artery calcification  I25.10    I25.84    Recommend discussion with primary care Patient not inclined for further workup at present    4. Former smoker  Z87.891    Quit 3 weeks ago So far doing well     Orders Placed This Encounter  Procedures   Pulmonary Function Test [facility] Only    Standing Status:   Future    Standing Expiration Date:   08/31/2024    Order Specific  Question:   Full PFT: includes the following: basic spirometry, spirometry pre & post bronchodilator, diffusion capacity (DLCO), lung volumes    Answer:   Full PFT    Order Specific Question:   This test can only be performed at    Answer:   [facility]   Will see the patient towards the end of October after her follow-up CT is done.  She is to contact us prior to that time strainer difficulties arise.   Gailen Shelter, MD Advanced Bronchoscopy PCCM [facility]    *This note was dictated using voice recognition software/Dragon.  Despite best efforts to proofread, errors can occur which can change the meaning. Any transcriptional errors that result from this process are unintentional and may  not be fully corrected at the time of dictation.

## 2023-10-01 ENCOUNTER — Ambulatory Visit: Payer: Medicare Other | Attending: Pulmonary Disease

## 2023-10-01 DIAGNOSIS — J449 Chronic obstructive pulmonary disease, unspecified: Secondary | ICD-10-CM | POA: Insufficient documentation

## 2023-10-01 LAB — PULMONARY FUNCTION TEST ARMC ONLY
DL/VA % pred: 58 %
DL/VA: 2.37 ml/min/mmHg/L
DLCO unc % pred: 52 %
DLCO unc: 10.78 ml/min/mmHg
FEF 25-75 Post: 1.71 L/s
FEF 25-75 Pre: 1.2 L/s
FEF2575-%Change-Post: 42 %
FEF2575-%Pred-Post: 93 %
FEF2575-%Pred-Pre: 65 %
FEV1-%Change-Post: 9 %
FEV1-%Pred-Post: 79 %
FEV1-%Pred-Pre: 72 %
FEV1-Post: 1.87 L
FEV1-Pre: 1.7 L
FEV1FVC-%Change-Post: 3 %
FEV1FVC-%Pred-Pre: 94 %
FEV6-%Change-Post: 6 %
FEV6-%Pred-Post: 85 %
FEV6-%Pred-Pre: 80 %
FEV6-Post: 2.53 L
FEV6-Pre: 2.38 L
FEV6FVC-%Change-Post: 0 %
FEV6FVC-%Pred-Post: 104 %
FEV6FVC-%Pred-Pre: 104 %
FVC-%Change-Post: 5 %
FVC-%Pred-Post: 81 %
FVC-%Pred-Pre: 77 %
FVC-Post: 2.53 L
FVC-Pre: 2.39 L
Post FEV1/FVC ratio: 74 %
Post FEV6/FVC ratio: 100 %
Pre FEV1/FVC ratio: 71 %
Pre FEV6/FVC Ratio: 99 %
RV % pred: 103 %
RV: 2.44 L
TLC % pred: 91 %
TLC: 4.89 L

## 2023-10-01 MED ORDER — ALBUTEROL SULFATE (2.5 MG/3ML) 0.083% IN NEBU
2.5000 mg | INHALATION_SOLUTION | Freq: Once | RESPIRATORY_TRACT | Status: AC
  Administered 2023-10-01: 2.5 mg via RESPIRATORY_TRACT
  Filled 2023-10-01: qty 3

## 2023-10-05 DIAGNOSIS — C44319 Basal cell carcinoma of skin of other parts of face: Secondary | ICD-10-CM | POA: Diagnosis not present

## 2023-10-05 DIAGNOSIS — L258 Unspecified contact dermatitis due to other agents: Secondary | ICD-10-CM | POA: Diagnosis not present

## 2023-10-05 DIAGNOSIS — L570 Actinic keratosis: Secondary | ICD-10-CM | POA: Diagnosis not present

## 2023-10-05 DIAGNOSIS — D485 Neoplasm of uncertain behavior of skin: Secondary | ICD-10-CM | POA: Diagnosis not present

## 2023-10-07 ENCOUNTER — Encounter: Payer: Self-pay | Admitting: Internal Medicine

## 2023-10-07 ENCOUNTER — Ambulatory Visit
Admission: RE | Admit: 2023-10-07 | Discharge: 2023-10-07 | Disposition: A | Discharge status: Home / Self Care | Payer: Medicare Other | Source: Ambulatory Visit | Attending: Acute Care | Admitting: Acute Care

## 2023-10-07 ENCOUNTER — Ambulatory Visit (INDEPENDENT_AMBULATORY_CARE_PROVIDER_SITE_OTHER): Payer: Medicare Other | Admitting: Internal Medicine

## 2023-10-07 ENCOUNTER — Other Ambulatory Visit: Payer: Self-pay | Admitting: Internal Medicine

## 2023-10-07 VITALS — BP 118/68 | HR 90 | Ht 66.0 in | Wt 170.0 lb

## 2023-10-07 DIAGNOSIS — E1169 Type 2 diabetes mellitus with other specified complication: Secondary | ICD-10-CM

## 2023-10-07 DIAGNOSIS — Z7984 Long term (current) use of oral hypoglycemic drugs: Secondary | ICD-10-CM

## 2023-10-07 DIAGNOSIS — I2510 Atherosclerotic heart disease of native coronary artery without angina pectoris: Secondary | ICD-10-CM | POA: Diagnosis not present

## 2023-10-07 DIAGNOSIS — E119 Type 2 diabetes mellitus without complications: Secondary | ICD-10-CM

## 2023-10-07 DIAGNOSIS — R911 Solitary pulmonary nodule: Secondary | ICD-10-CM | POA: Diagnosis not present

## 2023-10-07 DIAGNOSIS — I700 Atherosclerosis of aorta: Secondary | ICD-10-CM | POA: Diagnosis not present

## 2023-10-07 DIAGNOSIS — Z122 Encounter for screening for malignant neoplasm of respiratory organs: Secondary | ICD-10-CM | POA: Diagnosis not present

## 2023-10-07 DIAGNOSIS — J432 Centrilobular emphysema: Secondary | ICD-10-CM | POA: Diagnosis not present

## 2023-10-07 LAB — POCT GLYCOSYLATED HEMOGLOBIN (HGB A1C): Hemoglobin A1C: 7.8 % — AB (ref 4.0–5.6)

## 2023-10-07 MED ORDER — METFORMIN HCL ER 500 MG PO TB24
1000.0000 mg | ORAL_TABLET | Freq: Two times a day (BID) | ORAL | 1 refills | Status: DC
Start: 2023-10-07 — End: 2024-04-05

## 2023-10-07 NOTE — Progress Notes (Signed)
Date:  10/07/2023   Name:  Suzanne Richardson   DOB:  06-Mar-1951   MRN:  [id number]   Chief Complaint: Diabetes  Diabetes She presents for her follow-up diabetic visit. She has type 2 diabetes mellitus. Pertinent negatives for hypoglycemia include no headaches or tremors. Pertinent negatives for diabetes include no chest pain, no fatigue, no polydipsia and no polyuria. Pertinent negatives for diabetic complications include no heart disease or peripheral neuropathy. Current diabetic treatment includes oral agent (dual therapy).    Review of Systems  Constitutional:  Negative for appetite change, fatigue, fever and unexpected weight change.  HENT:  Negative for tinnitus and trouble swallowing.   Eyes:  Negative for visual disturbance.  Respiratory:  Negative for cough, chest tightness and shortness of breath.   Cardiovascular:  Negative for chest pain, palpitations and leg swelling.  Gastrointestinal:  Negative for abdominal pain.  Endocrine: Negative for polydipsia and polyuria.  Genitourinary:  Negative for dysuria and hematuria.  Musculoskeletal:  Negative for arthralgias.  Neurological:  Negative for tremors, numbness and headaches.  Psychiatric/Behavioral:  Negative for dysphoric mood.      Lab Results  Component Value Date   NA 140 01/20/2023   K 3.7 01/20/2023   CO2 21 01/20/2023   GLUCOSE 128 (H) 01/20/2023   BUN 10 01/20/2023   CREATININE 0.67 01/20/2023   CALCIUM 9.6 01/20/2023   EGFR 92 01/20/2023   GFRNONAA 93 02/05/2021   Lab Results  Component Value Date   CHOL 155 01/20/2023   HDL 61 01/20/2023   LDLCALC 73 01/20/2023   TRIG 121 01/20/2023   CHOLHDL 2.5 01/20/2023   Lab Results  Component Value Date   TSH 1.180 02/05/2021   Lab Results  Component Value Date   HGBA1C 7.8 (A) 10/07/2023   Lab Results  Component Value Date   WBC 10.4 01/20/2023   HGB 13.6 01/20/2023   HCT 39.4 01/20/2023   MCV 91 01/20/2023   PLT 304 01/20/2023   Lab Results   Component Value Date   ALT 18 01/20/2023   AST 14 01/20/2023   ALKPHOS 85 01/20/2023   BILITOT 0.2 01/20/2023   No results found for: "25OHVITD2", "25OHVITD3", "VD25OH"   Patient Active Problem List   Diagnosis Date Noted   COPD suggested by initial evaluation (HCC) 10/01/2023   Restless leg syndrome 05/29/2023   Centrilobular emphysema (HCC) 09/13/2021   Pulmonary nodule 1 cm or greater in diameter 09/13/2021   Aortic atherosclerosis (HCC) 07/10/2021   Hyperlipidemia associated with type 2 diabetes mellitus (HCC) 05/08/2021   Diabetes mellitus treated with oral medication (HCC) 02/05/2021   Tobacco use disorder 02/05/2021    No Known Allergies  Past Surgical History:  Procedure Laterality Date   CATARACT EXTRACTION W/PHACO Left 07/15/2019   Procedure: CATARACT EXTRACTION PHACO AND INTRAOCULAR LENS PLACEMENT (IOC) LEFT;  Surgeon: Nevada Crane, MD;  Location: [facility];  Service: Ophthalmology;  Laterality: Left;   CATARACT EXTRACTION W/PHACO Right 08/08/2019   Procedure: CATARACT EXTRACTION PHACO AND INTRAOCULAR LENS PLACEMENT (IOC) RIGHT;  Surgeon: Nevada Crane, MD;  Location: [facility];  Service: Ophthalmology;  Laterality: Right;    Social History   Tobacco Use   Smoking status: Former    Current packs/day: 0.00    Average packs/day: 0.8 packs/day for 60.0 years (48.0 ttl pk-yrs)    Types: Cigarettes    Quit date: 07/30/2023    Years since quitting: 0.1   Smokeless tobacco: Never   Tobacco comments:  Quit in 08/06/2023. Smoked 1PPD for 60 years.  Vaping Use   Vaping status: Never Used  Substance Use Topics   Alcohol use: Not Currently   Drug use: Never     Medication list has been reviewed and updated.  Current Meds  Medication Sig   atorvastatin (LIPITOR) 10 MG tablet Take 1 tablet by mouth once daily   buPROPion (WELLBUTRIN SR) 150 MG 12 hr tablet Take 1 tablet by mouth twice daily   glimepiride (AMARYL) 2 MG tablet TAKE 1  TABLET BY MOUTH ONCE DAILY BEFORE BREAKFAST   Multiple Vitamin (MULTIVITAMIN) tablet Take 1 tablet by mouth daily.   rOPINIRole (REQUIP) 0.25 MG tablet TAKE 1 TABLET BY MOUTH AT BEDTIME AS NEEDED   [DISCONTINUED] metFORMIN (GLUCOPHAGE-XR) 500 MG 24 hr tablet TAKE 2 TABLETS BY MOUTH DAILY WITH BREAKFAST AND 1 TABLET BY MOUTH AFTER SUPPER       10/07/2023    9:56 AM 05/29/2023    9:20 AM 01/20/2023    9:39 AM 09/15/2022    9:56 AM  GAD 7 : Generalized Anxiety Score  Nervous, Anxious, on Edge 0 0 0 0  Control/stop worrying 0 0 0 0  Worry too much - different things 0 0 0 0  Trouble relaxing 0 0 0 0  Restless 0 0 0 0  Easily annoyed or irritable 0 0 0 0  Afraid - awful might happen 0 0 0 0  Total GAD 7 Score 0 0 0 0  Anxiety Difficulty Not difficult at all Not difficult at all Not difficult at all Not difficult at all       10/07/2023    9:56 AM 05/29/2023    9:20 AM 01/20/2023    9:39 AM  Depression screen PHQ 2/9  Decreased Interest 0 0 1  Down, Depressed, Hopeless 0 0 0  PHQ - 2 Score 0 0 1  Altered sleeping 2 1 2   Tired, decreased energy 1 2 0  Change in appetite 2 0 0  Feeling bad or failure about yourself  0 0 0  Trouble concentrating 0 0 0  Moving slowly or fidgety/restless 0 0 0  Suicidal thoughts 0 0 0  PHQ-9 Score 5 3 3   Difficult doing work/chores Not difficult at all Not difficult at all Not difficult at all    BP Readings from Last 3 Encounters:  10/07/23 118/68  09/01/23 132/80  05/29/23 120/68    Physical Exam Vitals and nursing note reviewed.  Constitutional:      General: She is not in acute distress.    Appearance: She is well-developed.  HENT:     Head: Normocephalic and atraumatic.  Neck:     Vascular: No carotid bruit.  Cardiovascular:     Rate and Rhythm: Normal rate and regular rhythm.     Pulses: Normal pulses.  Pulmonary:     Effort: Pulmonary effort is normal. No respiratory distress.     Breath sounds: No wheezing or rhonchi.   Musculoskeletal:     Cervical back: Normal range of motion.     Right lower leg: No edema.     Left lower leg: No edema.  Lymphadenopathy:     Cervical: No cervical adenopathy.  Skin:    General: Skin is warm and dry.     Findings: No rash.  Neurological:     Mental Status: She is alert and oriented to person, place, and time.  Psychiatric:        Mood and Affect:  Mood normal.        Behavior: Behavior normal.    Diabetic Foot Exam - Simple   Simple Foot Form Diabetic Foot exam was performed with the following findings: Yes 10/07/2023 10:05 AM  Visual Inspection No deformities, no ulcerations, no other skin breakdown bilaterally: Yes Sensation Testing Intact to touch and monofilament testing bilaterally: Yes Pulse Check Posterior Tibialis and Dorsalis pulse intact bilaterally: Yes Comments      Wt Readings from Last 3 Encounters:  10/07/23 170 lb (77.1 kg)  09/01/23 166 lb 12.8 oz (75.7 kg)  05/29/23 165 lb (74.8 kg)    BP 118/68   Pulse 90   Ht 5\' 6"  (1.676 m)   Wt 170 lb (77.1 kg)   SpO2 95%   BMI 27.44 kg/m   Assessment and Plan:  Problem List Items Addressed This Visit       Unprioritized   Diabetes mellitus treated with oral medication (HCC) - Primary    Blood sugars stable without hypoglycemic symptoms or events. Current regimen is metformin and glimepiride. Changes made last visit are none. Lab Results  Component Value Date   HGBA1C 7.2 (A) 05/29/2023  Eye exam is due. A1C today = 7.8 Will increase metformin to 1000 mg bid.       Relevant Medications   metFORMIN (GLUCOPHAGE-XR) 500 MG 24 hr tablet   Other Relevant Orders   POCT glycosylated hemoglobin (Hb A1C) (Completed)   Microalbumin / creatinine urine ratio    Return in about 4 months (around 02/07/2024) for DM, lipid, Medicare annual.    Reubin Milan, MD [facility] [place]

## 2023-10-07 NOTE — Telephone Encounter (Signed)
Requested Prescriptions  Pending Prescriptions Disp Refills   atorvastatin (LIPITOR) 10 MG tablet [Pharmacy Med Name: Atorvastatin Calcium 10 MG Oral Tablet] 90 tablet 0    Sig: Take 1 tablet by mouth once daily     Cardiovascular:  Antilipid - Statins Failed - 10/07/2023  6:54 AM      Failed - Lipid Panel in normal range within the last 12 months    Cholesterol, Total  Date Value Ref Range Status  01/20/2023 155 100 - 199 mg/dL Final   LDL Chol Calc (NIH)  Date Value Ref Range Status  01/20/2023 73 0 - 99 mg/dL Final   HDL  Date Value Ref Range Status  01/20/2023 61 >39 mg/dL Final   Triglycerides  Date Value Ref Range Status  01/20/2023 121 0 - 149 mg/dL Final         Passed - Patient is not pregnant      Passed - Valid encounter within last 12 months    Recent Outpatient Visits           Today Diabetes mellitus treated with oral medication (HCC)   [place] [facility] at [facility], Nyoka Cowden, MD   4 months ago Diabetes mellitus treated with oral medication [facility])   [place] [facility] at [facility], Nyoka Cowden, MD   8 months ago Type II diabetes mellitus with complication [facility])   [place] [facility] at [facility], Nyoka Cowden, MD   1 year ago Type II diabetes mellitus with complication [facility])   [place] [facility] at [facility], Nyoka Cowden, MD   1 year ago Type II diabetes mellitus with complication [facility])   [place] [facility] at [facility], Nyoka Cowden, MD       Future Appointments             In 4 months Judithann Graves, Nyoka Cowden, MD [facility] at [facility], [facility]

## 2023-10-07 NOTE — Patient Instructions (Signed)
Increase Metformin to 2 tabs twice a day.

## 2023-10-07 NOTE — Assessment & Plan Note (Addendum)
Blood sugars stable without hypoglycemic symptoms or events. Current regimen is metformin and glimepiride. Changes made last visit are none. Lab Results  Component Value Date   HGBA1C 7.2 (A) 05/29/2023  Eye exam is due. A1C today = 7.8 Will increase metformin to 1000 mg bid.

## 2023-10-08 ENCOUNTER — Ambulatory Visit: Payer: Medicare Other | Admitting: Pulmonary Disease

## 2023-10-08 ENCOUNTER — Encounter: Payer: Self-pay | Admitting: Pulmonary Disease

## 2023-10-08 VITALS — BP 138/84 | HR 94 | Temp 98.1°F | Ht 66.0 in | Wt 169.2 lb

## 2023-10-08 DIAGNOSIS — J449 Chronic obstructive pulmonary disease, unspecified: Secondary | ICD-10-CM

## 2023-10-08 DIAGNOSIS — Z87891 Personal history of nicotine dependence: Secondary | ICD-10-CM

## 2023-10-08 DIAGNOSIS — R911 Solitary pulmonary nodule: Secondary | ICD-10-CM

## 2023-10-08 LAB — MICROALBUMIN / CREATININE URINE RATIO
Creatinine, Urine: 97.2 mg/dL
Microalb/Creat Ratio: 14 mg/g{creat} (ref 0–29)
Microalbumin, Urine: 14 ug/mL

## 2023-10-08 MED ORDER — BREZTRI AEROSPHERE 160-9-4.8 MCG/ACT IN AERO: 2.0000 | INHALATION_SPRAY | Freq: Two times a day (BID) | RESPIRATORY_TRACT | 0 refills | Status: DC

## 2023-10-08 NOTE — Progress Notes (Signed)
Subjective:    Patient ID: Suzanne Richardson, female    DOB: Apr 25, 1951, 72 y.o.   MRN: [id number]  Patient Care Team: Reubin Milan, MD as PCP - General (Internal Medicine) Verne Carrow, OD [facility])  Chief Complaint  Patient presents with   Follow-up    DOE. No wheezing or cough.    HPI Suzanne Richardson is a 72 year old recent former smoker (83 PY) who presents for follow-up of abnormal lung cancer screening CT. she was initially seen on 01 September 2023 at the patient has been asymptomatic with regards to these findings.  She had a lung cancer screening CT on 07 July 2023 that showed diffuse bronchial wall thickening and moderate centrilobular and paraseptal emphysema as well as a cystic area on the right upper lobe with one of the edges having developed nodular thickening averaging 9.9 mm.  The area in question is not amenable for biopsy at present due to the nature of the lesion.  She had follow-up CT chest on 07 October 2023 showing similar changes, however radiologist interpretation is still pending.  There is also diffuse bronchial wall thickening with moderate centrilobular and paraseptal emphysema consistent with COPD.  She had PFTs performed on 01 October 2023 that showed mild obstructive airways disease with moderate diffusion impairment consistent with emphysema.  She has had shortness of breath on exertion for a number of years and does not note any change on this.  She states that previously she was given a trial of Anoro Ellipta by her primary care physician and that this helped her with this issue.  However, she was unable to get the Anoro due to not being covered by her insurance.  She has had no fevers, chills or sweats.  No cough or sputum production.  No hemoptysis.  She has no wheezing.  No orthopnea, paroxysmal nocturnal dyspnea, lower extremity edema or calf tenderness.  No weight loss or anorexia.  I reviewed the new CT with the patient.  Overall I do not see significant change  however as noted radiology interpretation is pending.  She is aware that if she would need biopsy of this she would actually require surgical biopsy.  DATA 10/01/2023 PFTs: FEV1 1.70 L or 72% predicted, FVC 2.39 L or 77% predicted, FEV1/FVC 71%.  Lung volumes normal.  Diffusion capacity moderately reduced.  No bronchodilator response.  Consistent with stage I-II COPD.  Review of Systems A 10 point review of systems was performed and it is as noted above otherwise negative.   Patient Active Problem List   Diagnosis Date Noted   COPD suggested by initial evaluation (HCC) 10/01/2023   Restless leg syndrome 05/29/2023   Centrilobular emphysema (HCC) 09/13/2021   Pulmonary nodule 1 cm or greater in diameter 09/13/2021   Aortic atherosclerosis (HCC) 07/10/2021   Hyperlipidemia associated with type 2 diabetes mellitus (HCC) 05/08/2021   Diabetes mellitus treated with oral medication (HCC) 02/05/2021   Tobacco use disorder 02/05/2021    Social History   Tobacco Use   Smoking status: Former    Current packs/day: 0.00    Average packs/day: 0.8 packs/day for 60.0 years (48.0 ttl pk-yrs)    Types: Cigarettes    Quit date: 07/30/2023    Years since quitting: 0.1   Smokeless tobacco: Never   Tobacco comments:    Quit in 08/06/2023. Smoked 1PPD for 60 years.  Substance Use Topics   Alcohol use: Not Currently    No Known Allergies  Current Meds  Medication Sig  atorvastatin (LIPITOR) 10 MG tablet Take 1 tablet by mouth once daily   buPROPion (WELLBUTRIN SR) 150 MG 12 hr tablet Take 1 tablet by mouth twice daily   glimepiride (AMARYL) 2 MG tablet TAKE 1 TABLET BY MOUTH ONCE DAILY BEFORE BREAKFAST   metFORMIN (GLUCOPHAGE-XR) 500 MG 24 hr tablet Take 2 tablets (1,000 mg total) by mouth 2 (two) times daily with a meal.   Multiple Vitamin (MULTIVITAMIN) tablet Take 1 tablet by mouth daily.   rOPINIRole (REQUIP) 0.25 MG tablet TAKE 1 TABLET BY MOUTH AT BEDTIME AS NEEDED     There is no  immunization history on file for this patient.      Objective:     BP 138/84 (BP Location: Right Arm, Cuff Size: Normal)   Pulse 94   Temp 98.1 F (36.7 C)   Ht 5\' 6"  (1.676 m)   Wt 169 lb 3.2 oz (76.7 kg)   SpO2 93%   BMI 27.31 kg/m   SpO2: 93 % O2 Device: None (Room air)  GENERAL: Well-developed, overweight woman, no acute distress.  No conversational dyspnea. HEAD: Normocephalic, atraumatic.  EYES: Pupils equal, round, reactive to light.  No scleral icterus.  MOUTH: Dentition intact, oral mucosa moist. NECK: Supple. No thyromegaly. Trachea midline. No JVD.  No adenopathy. PULMONARY: Good air entry bilaterally.  No adventitious sounds. CARDIOVASCULAR: S1 and S2. Regular rate and rhythm.  No rubs, murmurs or gallops heard. ABDOMEN: Benign. MUSCULOSKELETAL: No joint deformity, no clubbing, no edema.  NEUROLOGIC: No overt focal deficit, no gait disturbance noted, speech is fluent. SKIN: Intact,warm,dry. PSYCH: Mood and behavior normal.     Assessment & Plan:     ICD-10-CM   1. Stage 1 mild COPD by GOLD classification (HCC)  J44.9    Trial of Breztri 2 puffs twice a day Samples provided for the patient    2. Lung nodule seen on imaging study  R91.1    This is associated with a large pulmonary cystic lesion Formal interpretation by radiology pending    3. Former smoker  Z87.891    No evidence of relapse     Meds ordered this encounter  Medications   Budeson-Glycopyrrol-Formoterol (BREZTRI AEROSPHERE) 160-9-4.8 MCG/ACT AERO    Sig: Inhale 2 puffs into the lungs in the morning and at bedtime.    Dispense:  11.8 g    Refill:  0    Order Specific Question:   Lot Number?    Answer:   [id number]    Order Specific Question:   Expiration Date?    Answer:   01/29/2026    Order Specific Question:   Manufacturer?    Answer:   [employer] [[id number]]    Order Specific Question:   Quantity    Answer:   2   Will see the patient in follow-up in 3 months time.  Will notify the  patient if there is any other new concerns with regards to the most recent CT once the radiologist reports come back.  Will continue to follow mostly.   Gailen Shelter, MD Advanced Bronchoscopy PCCM [place] [facility]    *This note was dictated using voice recognition software/Dragon.  Despite best efforts to proofread, errors can occur which can change the meaning. Any transcriptional errors that result from this process are unintentional and may not be fully corrected at the time of dictation.

## 2023-10-08 NOTE — Patient Instructions (Signed)
We will wait until the CT scan is read by the radiologist.  It may be that the recommendation is for a PET/CT.  We will discuss that when the report comes back.  My inclination at present is just to keep following this.  I am giving a trial of an inhaler called Breztri this is 2 puffs twice a day.  Make sure you rinse your mouth well after use it.  I want to see if this improves on your breathing.  If it does, we can provide you forms to fill out to get the medication at no cost to you through the manufacturer.  We will see on follow-up in 3 months time call sooner should any new problems arise.

## 2023-11-04 ENCOUNTER — Telehealth: Payer: Self-pay | Admitting: Acute Care

## 2023-11-04 DIAGNOSIS — R911 Solitary pulmonary nodule: Secondary | ICD-10-CM

## 2023-11-04 DIAGNOSIS — Z87891 Personal history of nicotine dependence: Secondary | ICD-10-CM

## 2023-11-04 NOTE — Telephone Encounter (Signed)
This nodule is shrinking, from 9.9 mm to 8 mm. 3 month follow up is fine. Please call the patient and fax results to PCP. Order 3 month follow up from date of the scan 10/07/2023. Thanks so much

## 2023-11-05 NOTE — Telephone Encounter (Signed)
Spoke with pt and advised of recommendations per Kandice Robinsons, NP to repeat CT in 3 months to make sure nodule is continuing to decrease in size. PT verbalized understanding. Results/ plans faxed to PCP. Order placed for 3 month nodule f/u CT.

## 2023-11-05 NOTE — Telephone Encounter (Signed)
Attempted to contact pt. Left VM for pt to call back regarding CT results.

## 2023-11-13 DIAGNOSIS — C44319 Basal cell carcinoma of skin of other parts of face: Secondary | ICD-10-CM | POA: Diagnosis not present

## 2023-11-15 DIAGNOSIS — C44319 Basal cell carcinoma of skin of other parts of face: Secondary | ICD-10-CM | POA: Diagnosis not present

## 2023-11-19 ENCOUNTER — Other Ambulatory Visit: Payer: Self-pay | Admitting: Internal Medicine

## 2023-11-19 DIAGNOSIS — E118 Type 2 diabetes mellitus with unspecified complications: Secondary | ICD-10-CM

## 2023-11-20 NOTE — Telephone Encounter (Signed)
Requested Prescriptions  Pending Prescriptions Disp Refills   glimepiride (AMARYL) 2 MG tablet [Pharmacy Med Name: Glimepiride 2 MG Oral Tablet] 90 tablet 0    Sig: TAKE 1 TABLET BY MOUTH ONCE DAILY BEFORE BREAKFAST     Endocrinology:  Diabetes - Sulfonylureas Passed - 11/19/2023  6:55 AM      Passed - HBA1C is between 0 and 7.9 and within 180 days    Hemoglobin A1C  Date Value Ref Range Status  10/07/2023 7.8 (A) 4.0 - 5.6 % Final   Hgb A1c MFr Bld  Date Value Ref Range Status  01/20/2023 7.3 (H) 4.8 - 5.6 % Final    Comment:             Prediabetes: 5.7 - 6.4          Diabetes: >6.4          Glycemic control for adults with diabetes: <7.0          Passed - Cr in normal range and within 360 days    Creatinine, Ser  Date Value Ref Range Status  01/20/2023 0.67 0.57 - 1.00 mg/dL Final         Passed - Valid encounter within last 6 months    Recent Outpatient Visits           1 month ago Diabetes mellitus treated with oral medication (HCC)   [place] [facility] at [facility], Nyoka Cowden, MD   5 months ago Diabetes mellitus treated with oral medication [facility]   [place] [facility] at [facility], Nyoka Cowden, MD   10 months ago Type II diabetes mellitus with complication [facility])   [place] [facility] at [facility], Nyoka Cowden, MD   1 year ago Type II diabetes mellitus with complication [facility])   [place] [facility] at [facility], Nyoka Cowden, MD   1 year ago Type II diabetes mellitus with complication [facility])   [place] [facility] at [facility], Nyoka Cowden, MD       Future Appointments             In 3 months Judithann Graves, Nyoka Cowden, MD [facility] & [facility] Medicine at [facility], [facility]

## 2023-12-15 ENCOUNTER — Other Ambulatory Visit: Payer: Self-pay | Admitting: Internal Medicine

## 2023-12-15 DIAGNOSIS — F17200 Nicotine dependence, unspecified, uncomplicated: Secondary | ICD-10-CM

## 2023-12-15 NOTE — Telephone Encounter (Signed)
Requested Prescriptions  Pending Prescriptions Disp Refills   buPROPion (WELLBUTRIN SR) 150 MG 12 hr tablet [Pharmacy Med Name: buPROPion HCl ER (SR) 150 MG Oral Tablet Extended Release 12 Hour] 180 tablet 0    Sig: Take 1 tablet by mouth twice daily     Psychiatry: Antidepressants - bupropion Passed - 12/15/2023  2:55 PM      Passed - Cr in normal range and within 360 days    Creatinine, Ser  Date Value Ref Range Status  01/20/2023 0.67 0.57 - 1.00 mg/dL Final         Passed - AST in normal range and within 360 days    AST  Date Value Ref Range Status  01/20/2023 14 0 - 40 IU/L Final         Passed - ALT in normal range and within 360 days    ALT  Date Value Ref Range Status  01/20/2023 18 0 - 32 IU/L Final         Passed - Last BP in normal range    BP Readings from Last 1 Encounters:  10/08/23 138/84         Passed - Valid encounter within last 6 months    Recent Outpatient Visits           2 months ago Diabetes mellitus treated with oral medication (HCC)   [place] [facility] at [facility], Nyoka Cowden, MD   6 months ago Diabetes mellitus treated with oral medication [facility])   [place] [facility] at [facility], Nyoka Cowden, MD   10 months ago Type II diabetes mellitus with complication [facility])   [place] [facility] at [facility], Nyoka Cowden, MD   1 year ago Type II diabetes mellitus with complication [facility])   [place] [facility] at [facility], Nyoka Cowden, MD   1 year ago Type II diabetes mellitus with complication [facility])   [place] [facility] at [facility], Nyoka Cowden, MD       Future Appointments             In 2 months Judithann Graves, Nyoka Cowden, MD [facility] at [facility], [facility]

## 2023-12-22 ENCOUNTER — Other Ambulatory Visit: Payer: Self-pay | Admitting: Internal Medicine

## 2023-12-22 DIAGNOSIS — G2581 Restless legs syndrome: Secondary | ICD-10-CM

## 2023-12-22 NOTE — Telephone Encounter (Signed)
Requested Prescriptions  Pending Prescriptions Disp Refills   rOPINIRole (REQUIP) 0.25 MG tablet [Pharmacy Med Name: rOPINIRole HCl 0.25 MG Oral Tablet] 90 tablet 0    Sig: TAKE 1 TABLET BY MOUTH AT BEDTIME AS NEEDED     Neurology:  Parkinsonian Agents Passed - 12/22/2023  3:36 PM      Passed - Last BP in normal range    BP Readings from Last 1 Encounters:  10/08/23 138/84         Passed - Last Heart Rate in normal range    Pulse Readings from Last 1 Encounters:  10/08/23 94         Passed - Valid encounter within last 12 months    Recent Outpatient Visits           2 months ago Diabetes mellitus treated with oral medication (HCC)   [place] [facility] at [facility], Nyoka Cowden, MD   6 months ago Diabetes mellitus treated with oral medication [facility])   [place] [facility] at [facility], Nyoka Cowden, MD   11 months ago Type II diabetes mellitus with complication [facility])   [place] [facility] at [facility], Nyoka Cowden, MD   1 year ago Type II diabetes mellitus with complication [facility])   [place] [facility] at [facility], Nyoka Cowden, MD   1 year ago Type II diabetes mellitus with complication [facility])   [place] [facility] at [facility], Nyoka Cowden, MD       Future Appointments             In 2 months Judithann Graves, Nyoka Cowden, MD [facility] at [facility], [facility]

## 2024-01-06 ENCOUNTER — Other Ambulatory Visit: Payer: Self-pay | Admitting: Internal Medicine

## 2024-01-06 DIAGNOSIS — E1169 Type 2 diabetes mellitus with other specified complication: Secondary | ICD-10-CM

## 2024-01-07 ENCOUNTER — Ambulatory Visit
Admission: RE | Admit: 2024-01-07 | Discharge: 2024-01-07 | Disposition: A | Discharge status: Home / Self Care | Payer: Medicare Other | Source: Ambulatory Visit | Attending: Acute Care | Admitting: Acute Care

## 2024-01-07 DIAGNOSIS — J432 Centrilobular emphysema: Secondary | ICD-10-CM | POA: Diagnosis not present

## 2024-01-07 DIAGNOSIS — R911 Solitary pulmonary nodule: Secondary | ICD-10-CM | POA: Insufficient documentation

## 2024-01-07 DIAGNOSIS — Z87891 Personal history of nicotine dependence: Secondary | ICD-10-CM | POA: Diagnosis not present

## 2024-01-07 DIAGNOSIS — I2510 Atherosclerotic heart disease of native coronary artery without angina pectoris: Secondary | ICD-10-CM | POA: Diagnosis not present

## 2024-01-07 DIAGNOSIS — R918 Other nonspecific abnormal finding of lung field: Secondary | ICD-10-CM | POA: Diagnosis not present

## 2024-01-08 NOTE — Telephone Encounter (Signed)
 Requested medication (s) are due for refill today: Yes  Requested medication (s) are on the active medication list: Yes  Last refill:  10/07/23  Future visit scheduled: Yes  Notes to clinic:  Labs will be due 01/22/24, refill approval

## 2024-01-15 ENCOUNTER — Telehealth: Payer: Self-pay | Admitting: Acute Care

## 2024-01-15 DIAGNOSIS — R911 Solitary pulmonary nodule: Secondary | ICD-10-CM

## 2024-01-15 NOTE — Telephone Encounter (Signed)
IMPRESSION: 1. Lung-RADS 3, probably benign findings. Short-term follow-up in 6 months is recommended with repeat low-dose chest CT without contrast (please use the following order, "CT CHEST LCS NODULE FOLLOW-UP W/O CM"). New solid 4.5 mm posteromedial right upper lobe pulmonary nodule. 2. Two-vessel coronary atherosclerosis. 3. Aortic Atherosclerosis (ICD10-I70.0) and Emphysema (ICD10-J43.9).

## 2024-01-15 NOTE — Telephone Encounter (Addendum)
Suzanne Richardson for previous scan.

## 2024-01-15 NOTE — Telephone Encounter (Signed)
CT Lung Cancer Screening 01/07/24

## 2024-01-18 NOTE — Telephone Encounter (Signed)
Called and spoke to pt. Informed her of the results and recommendations of LDCT. Patient verbalized understanding and agreeable to plan. Follow up order for 6 month CT scan placed. Results and plan sent to PCP.

## 2024-01-18 NOTE — Telephone Encounter (Signed)
Patient had follow up LDCT scan on 01/07/2024 that resulted as a LR3 with a new 4mm nodule. After Kandice Robinsons NP reviewed scan she agrees the patient will need 6 month follow up scan, due 07/07/2024.

## 2024-01-18 NOTE — Addendum Note (Signed)
Addended by: Sheran Luz on: 01/18/2024 11:11 AM   Modules accepted: Orders

## 2024-02-17 ENCOUNTER — Other Ambulatory Visit: Payer: Self-pay | Admitting: Internal Medicine

## 2024-02-17 DIAGNOSIS — E118 Type 2 diabetes mellitus with unspecified complications: Secondary | ICD-10-CM

## 2024-02-17 NOTE — Telephone Encounter (Signed)
 Requested medication (s) are due for refill today: yes  Requested medication (s) are on the active medication list: yes  Last refill:  11/20/23 #90/0  Future visit scheduled: yes  Notes to clinic:  Unable to refill per protocol due to failed labs, no updated results.    Requested Prescriptions  Pending Prescriptions Disp Refills   glimepiride (AMARYL) 2 MG tablet [Pharmacy Med Name: Glimepiride 2 MG Oral Tablet] 90 tablet 0    Sig: TAKE 1 TABLET BY MOUTH ONCE DAILY BEFORE BREAKFAST     Endocrinology:  Diabetes - Sulfonylureas Failed - 02/17/2024  5:03 PM      Failed - Cr in normal range and within 360 days    Creatinine, Ser  Date Value Ref Range Status  01/20/2023 0.67 0.57 - 1.00 mg/dL Final         Passed - HBA1C is between 0 and 7.9 and within 180 days    Hemoglobin A1C  Date Value Ref Range Status  10/07/2023 7.8 (A) 4.0 - 5.6 % Final   Hgb A1c MFr Bld  Date Value Ref Range Status  01/20/2023 7.3 (H) 4.8 - 5.6 % Final    Comment:             Prediabetes: 5.7 - 6.4          Diabetes: >6.4          Glycemic control for adults with diabetes: <7.0          Passed - Valid encounter within last 6 months    Recent Outpatient Visits           4 months ago Diabetes mellitus treated with oral medication (HCC)   [place] [facility] at [facility], Nyoka Cowden, MD   8 months ago Diabetes mellitus treated with oral medication [facility])   [place] [facility] at [facility], Nyoka Cowden, MD   1 year ago Type II diabetes mellitus with complication [facility])   [place] [facility] at [facility], Nyoka Cowden, MD   1 year ago Type II diabetes mellitus with complication [facility])   [place] [facility] at [facility], Nyoka Cowden, MD   1 year ago Type II diabetes mellitus with complication [facility])   [place] [facility] at [facility], Nyoka Cowden, MD       Future Appointments             In 2 weeks Judithann Graves, Nyoka Cowden, MD [facility] Medicine at [facility], [facility]

## 2024-03-04 ENCOUNTER — Encounter: Payer: Self-pay | Admitting: Internal Medicine

## 2024-03-04 ENCOUNTER — Ambulatory Visit (INDEPENDENT_AMBULATORY_CARE_PROVIDER_SITE_OTHER): Payer: Medicare Other | Admitting: Internal Medicine

## 2024-03-04 VITALS — BP 112/72 | HR 88 | Ht 66.0 in | Wt 167.2 lb

## 2024-03-04 DIAGNOSIS — E1169 Type 2 diabetes mellitus with other specified complication: Secondary | ICD-10-CM

## 2024-03-04 DIAGNOSIS — E785 Hyperlipidemia, unspecified: Secondary | ICD-10-CM

## 2024-03-04 DIAGNOSIS — R911 Solitary pulmonary nodule: Secondary | ICD-10-CM | POA: Diagnosis not present

## 2024-03-04 DIAGNOSIS — F17201 Nicotine dependence, unspecified, in remission: Secondary | ICD-10-CM

## 2024-03-04 DIAGNOSIS — Z7984 Long term (current) use of oral hypoglycemic drugs: Secondary | ICD-10-CM

## 2024-03-04 DIAGNOSIS — J432 Centrilobular emphysema: Secondary | ICD-10-CM

## 2024-03-04 DIAGNOSIS — E119 Type 2 diabetes mellitus without complications: Secondary | ICD-10-CM | POA: Diagnosis not present

## 2024-03-04 NOTE — Assessment & Plan Note (Addendum)
 Seen by Pulmonology and started on Breztri but unable to tolerate due to SE - "knives in the throat" She is not limited in her activities by shortness of breath Will continue to monitor off of medications for now.

## 2024-03-04 NOTE — Assessment & Plan Note (Addendum)
 Blood sugars stable without hypoglycemic symptoms or events. Currently managed with metformin and glimepiride Changes made last visit are increasing the dose of MTF. Lab Results  Component Value Date   HGBA1C 7.8 (A) 10/07/2023

## 2024-03-04 NOTE — Assessment & Plan Note (Signed)
 LDL is  Lab Results  Component Value Date   LDLCALC 73 01/20/2023   Current regimen is atorvastatin.  Tolerating medications well without issues.

## 2024-03-04 NOTE — Assessment & Plan Note (Addendum)
 Participating in lung cancer screening that transitioned to pulmonary nodule follow up The nodule is decreasing in size as of last CT in 12/2023 She continues to remain tobacco free.

## 2024-03-04 NOTE — Progress Notes (Signed)
 Date:  03/04/2024   Name:  Suzanne Richardson   DOB:  05/01/1951   MRN:  [id number]   Chief Complaint: Annual Exam Suzanne Richardson is a 73 y.o. female who presents today for her Complete Annual Exam. She feels well. She reports exercising none. She reports she is sleeping well. Breast complaints none.  She declines mammogram, vaccinations, but is participating in LDCT screening.  Health Maintenance  Topic Date Due   DTaP/Tdap/Td vaccine (1 - Tdap) Never done   Eye exam for diabetics  04/24/2023   Yearly kidney function blood test for diabetes  01/21/2024   Flu Shot  03/28/2024*   Pneumonia Vaccine (1 of 2 - PCV) 05/28/2024*   DEXA scan (bone density measurement)  05/28/2024*   Colon Cancer Screening  05/28/2024*   Hemoglobin A1C  04/06/2024   Medicare Annual Wellness Visit  05/28/2024   Yearly kidney health urinalysis for diabetes  10/06/2024   Complete foot exam   10/06/2024   Screening for Lung Cancer  01/06/2025   Hepatitis C Screening  Completed   HPV Vaccine  Aged Out   COVID-19 Vaccine  Discontinued   Zoster (Shingles) Vaccine  Discontinued  *Topic was postponed. The date shown is not the original due date.     Diabetes She presents for her follow-up diabetic visit. She has type 2 diabetes mellitus. Her disease course has been stable. Pertinent negatives for hypoglycemia include no headaches or tremors. Pertinent negatives for diabetes include no chest pain, no fatigue, no polydipsia and no polyuria. She participates in exercise intermittently. An ACE inhibitor/angiotensin II receptor blocker is not being taken. Eye exam is not current.  Hyperlipidemia This is a chronic problem. The problem is controlled. Pertinent negatives include no chest pain or shortness of breath. Current antihyperlipidemic treatment includes statins.    Review of Systems  Constitutional:  Negative for appetite change, fatigue, fever and unexpected weight change.  HENT:  Negative for tinnitus and trouble  swallowing.   Eyes:  Negative for visual disturbance.  Respiratory:  Negative for cough, chest tightness and shortness of breath.   Cardiovascular:  Negative for chest pain, palpitations and leg swelling.  Gastrointestinal:  Negative for abdominal pain.  Endocrine: Negative for polydipsia and polyuria.  Genitourinary:  Negative for dysuria and hematuria.  Musculoskeletal:  Negative for arthralgias.  Neurological:  Negative for tremors, numbness and headaches.  Psychiatric/Behavioral:  Negative for dysphoric mood.      Lab Results  Component Value Date   NA 140 01/20/2023   K 3.7 01/20/2023   CO2 21 01/20/2023   GLUCOSE 128 (H) 01/20/2023   BUN 10 01/20/2023   CREATININE 0.67 01/20/2023   CALCIUM 9.6 01/20/2023   EGFR 92 01/20/2023   GFRNONAA 93 02/05/2021   Lab Results  Component Value Date   CHOL 155 01/20/2023   HDL 61 01/20/2023   LDLCALC 73 01/20/2023   TRIG 121 01/20/2023   CHOLHDL 2.5 01/20/2023   Lab Results  Component Value Date   TSH 1.180 02/05/2021   Lab Results  Component Value Date   HGBA1C 7.8 (A) 10/07/2023   Lab Results  Component Value Date   WBC 10.4 01/20/2023   HGB 13.6 01/20/2023   HCT 39.4 01/20/2023   MCV 91 01/20/2023   PLT 304 01/20/2023   Lab Results  Component Value Date   ALT 18 01/20/2023   AST 14 01/20/2023   ALKPHOS 85 01/20/2023   BILITOT 0.2 01/20/2023   No results found for: "25OHVITD2", "  25OHVITD3", "VD25OH"   Patient Active Problem List   Diagnosis Date Noted   Restless leg syndrome 05/29/2023   Centrilobular emphysema (HCC) 09/13/2021   Pulmonary nodule 1 cm or greater in diameter 09/13/2021   Aortic atherosclerosis (HCC) 07/10/2021   Hyperlipidemia associated with type 2 diabetes mellitus (HCC) 05/08/2021   Diabetes mellitus treated with oral medication (HCC) 02/05/2021   Tobacco use disorder, moderate, in sustained remission 02/05/2021    No Known Allergies  Past Surgical History:  Procedure Laterality Date    CATARACT EXTRACTION W/PHACO Left 07/15/2019   Procedure: CATARACT EXTRACTION PHACO AND INTRAOCULAR LENS PLACEMENT (IOC) LEFT;  Surgeon: Nevada Crane, MD;  [facility];  Service: Ophthalmology;  Laterality: Left;   CATARACT EXTRACTION W/PHACO Right 08/08/2019   Procedure: CATARACT EXTRACTION PHACO AND INTRAOCULAR LENS PLACEMENT (IOC) RIGHT;  Surgeon: Nevada Crane, MD;  Location: [facility];  Service: Ophthalmology;  Laterality: Right;    Social History   Tobacco Use   Smoking status: Former    Current packs/day: 0.00    Average packs/day: 0.8 packs/day for 60.0 years (48.0 ttl pk-yrs)    Types: Cigarettes    Quit date: 07/30/2023    Years since quitting: 0.5   Smokeless tobacco: Never   Tobacco comments:    Quit in 08/06/2023. Smoked 1PPD for 60 years.  Vaping Use   Vaping status: Never Used  Substance Use Topics   Alcohol use: Not Currently   Drug use: Never     Medication list has been reviewed and updated.  Current Meds  Medication Sig   atorvastatin (LIPITOR) 10 MG tablet Take 1 tablet by mouth once daily   buPROPion (WELLBUTRIN SR) 150 MG 12 hr tablet Take 1 tablet by mouth twice daily   glimepiride (AMARYL) 2 MG tablet TAKE 1 TABLET BY MOUTH ONCE DAILY BEFORE BREAKFAST   metFORMIN (GLUCOPHAGE-XR) 500 MG 24 hr tablet Take 2 tablets (1,000 mg total) by mouth 2 (two) times daily with a meal.   Multiple Vitamin (MULTIVITAMIN) tablet Take 1 tablet by mouth daily.   rOPINIRole (REQUIP) 0.25 MG tablet TAKE 1 TABLET BY MOUTH AT BEDTIME AS NEEDED       03/04/2024   10:45 AM 10/07/2023    9:56 AM 05/29/2023    9:20 AM 01/20/2023    9:39 AM  GAD 7 : Generalized Anxiety Score  Nervous, Anxious, on Edge 0 0 0 0  Control/stop worrying 0 0 0 0  Worry too much - different things 0 0 0 0  Trouble relaxing 0 0 0 0  Restless 0 0 0 0  Easily annoyed or irritable 0 0 0 0  Afraid - awful might happen 0 0 0 0  Total GAD 7 Score 0 0 0 0  Anxiety  Difficulty Not difficult at all Not difficult at all Not difficult at all Not difficult at all       03/04/2024   10:45 AM 10/07/2023    9:56 AM 05/29/2023    9:20 AM  Depression screen PHQ 2/9  Decreased Interest 0 0 0  Down, Depressed, Hopeless 0 0 0  PHQ - 2 Score 0 0 0  Altered sleeping 0 2 1  Tired, decreased energy 2 1 2   Change in appetite 0 2 0  Feeling bad or failure about yourself  0 0 0  Trouble concentrating 0 0 0  Moving slowly or fidgety/restless 0 0 0  Suicidal thoughts 0 0 0  PHQ-9 Score 2 5  3  Difficult doing work/chores Not difficult at all Not difficult at all Not difficult at all    BP Readings from Last 3 Encounters:  03/04/24 112/72  10/08/23 138/84  10/07/23 118/68    Physical Exam Vitals and nursing note reviewed.  Constitutional:      General: She is not in acute distress.    Appearance: She is well-developed.  HENT:     Head: Normocephalic and atraumatic.     Right Ear: Tympanic membrane and ear canal normal.     Left Ear: Tympanic membrane and ear canal normal.     Nose:     Right Sinus: No maxillary sinus tenderness.     Left Sinus: No maxillary sinus tenderness.  Eyes:     General: No scleral icterus.       Right eye: No discharge.        Left eye: No discharge.     Conjunctiva/sclera: Conjunctivae normal.  Neck:     Thyroid: No thyromegaly.     Vascular: No carotid bruit.  Cardiovascular:     Rate and Rhythm: Normal rate and regular rhythm.     Pulses: Normal pulses.     Heart sounds: Normal heart sounds.  Pulmonary:     Effort: Pulmonary effort is normal. No respiratory distress.     Breath sounds: No wheezing.  Chest:     Comments: Declines breast exam Abdominal:     General: Bowel sounds are normal.     Palpations: Abdomen is soft.     Tenderness: There is no abdominal tenderness.  Musculoskeletal:     Cervical back: Normal range of motion. No erythema.     Right lower leg: No edema.     Left lower leg: No edema.   Lymphadenopathy:     Cervical: No cervical adenopathy.  Skin:    General: Skin is warm and dry.     Findings: No rash.  Neurological:     Mental Status: She is alert and oriented to person, place, and time.     Cranial Nerves: No cranial nerve deficit.     Sensory: No sensory deficit.     Deep Tendon Reflexes: Reflexes are normal and symmetric.  Psychiatric:        Attention and Perception: Attention normal.        Mood and Affect: Mood and affect normal.     Wt Readings from Last 3 Encounters:  03/04/24 167 lb 4 oz (75.9 kg)  10/08/23 169 lb 3.2 oz (76.7 kg)  10/07/23 170 lb (77.1 kg)    BP 112/72   Pulse 88   Ht 5\' 6"  (1.676 m)   Wt 167 lb 4 oz (75.9 kg)   SpO2 96%   BMI 26.99 kg/m   Assessment and Plan:  Problem List Items Addressed This Visit       Unprioritized   Hyperlipidemia associated with type 2 diabetes mellitus (HCC) (Chronic)   LDL is  Lab Results  Component Value Date   LDLCALC 73 01/20/2023   Current regimen is atorvastatin.  Tolerating medications well without issues.       Relevant Orders   Lipid panel   Centrilobular emphysema (HCC) (Chronic)   Seen by Pulmonology and started on Breztri but unable to tolerate due to SE - "knives in the throat" She is not limited in her activities by shortness of breath Will continue to monitor off of medications for now.      Relevant Orders   CBC  with Differential/Platelet   Diabetes mellitus treated with oral medication (HCC) - Primary   Blood sugars stable without hypoglycemic symptoms or events. Currently managed with metformin and glimepiride Changes made last visit are increasing the dose of MTF. Lab Results  Component Value Date   HGBA1C 7.8 (A) 10/07/2023         Relevant Orders   Comprehensive metabolic panel   Hemoglobin A1c   TSH   Tobacco use disorder, moderate, in sustained remission   Participating in lung cancer screening that transitioned to pulmonary nodule follow up The  nodule is decreasing in size as of last CT in 12/2023 She continues to remain tobacco free.      Pulmonary nodule 1 cm or greater in diameter   CT 12/2023: 1. Lung-RADS 3, probably benign findings. Short-term follow-up in 6 months is recommended with repeat low-dose chest CT without contrast (please use the following order, "CT CHEST LCS NODULE FOLLOW-UP W/O CM"). New solid 4.5 mm posteromedial right upper lobe pulmonary nodule.      Other Visit Diagnoses       Long term current use of oral hypoglycemic drug          HM - she continue to decline mammograms and all immunizations.  Return in about 4 months (around 07/04/2024) for DM.    Reubin Milan, MD [facility] Medicine [place]

## 2024-03-04 NOTE — Assessment & Plan Note (Signed)
 CT 12/2023: 1. Lung-RADS 3, probably benign findings. Short-term follow-up in 6 months is recommended with repeat low-dose chest CT without contrast (please use the following order, "CT CHEST LCS NODULE FOLLOW-UP W/O CM"). New solid 4.5 mm posteromedial right upper lobe pulmonary nodule.

## 2024-03-04 NOTE — Patient Instructions (Signed)
Please schedule an eye exam

## 2024-03-05 ENCOUNTER — Encounter: Payer: Self-pay | Admitting: Internal Medicine

## 2024-03-05 ENCOUNTER — Other Ambulatory Visit: Payer: Self-pay | Admitting: Internal Medicine

## 2024-03-05 DIAGNOSIS — E118 Type 2 diabetes mellitus with unspecified complications: Secondary | ICD-10-CM

## 2024-03-05 DIAGNOSIS — E119 Type 2 diabetes mellitus without complications: Secondary | ICD-10-CM

## 2024-03-05 LAB — CBC WITH DIFFERENTIAL/PLATELET
Basophils Absolute: 0 10*3/uL (ref 0.0–0.2)
Basos: 0 %
EOS (ABSOLUTE): 0.2 10*3/uL (ref 0.0–0.4)
Eos: 2 %
Hematocrit: 39.2 % (ref 34.0–46.6)
Hemoglobin: 13 g/dL (ref 11.1–15.9)
Immature Grans (Abs): 0 10*3/uL (ref 0.0–0.1)
Immature Granulocytes: 0 %
Lymphocytes Absolute: 3.1 10*3/uL (ref 0.7–3.1)
Lymphs: 28 %
MCH: 29.5 pg (ref 26.6–33.0)
MCHC: 33.2 g/dL (ref 31.5–35.7)
MCV: 89 fL (ref 79–97)
Monocytes Absolute: 0.8 10*3/uL (ref 0.1–0.9)
Monocytes: 7 %
Neutrophils Absolute: 7 10*3/uL (ref 1.4–7.0)
Neutrophils: 63 %
Platelets: 351 10*3/uL (ref 150–450)
RBC: 4.4 x10E6/uL (ref 3.77–5.28)
RDW: 14.5 % (ref 11.7–15.4)
WBC: 11 10*3/uL — ABNORMAL HIGH (ref 3.4–10.8)

## 2024-03-05 LAB — COMPREHENSIVE METABOLIC PANEL
ALT: 19 IU/L (ref 0–32)
AST: 16 IU/L (ref 0–40)
Albumin: 4.2 g/dL (ref 3.8–4.8)
Alkaline Phosphatase: 88 IU/L (ref 44–121)
BUN/Creatinine Ratio: 25 (ref 12–28)
BUN: 15 mg/dL (ref 8–27)
Bilirubin Total: <0.2 mg/dL (ref 0.0–1.2)
CO2: 22 mmol/L (ref 20–29)
Calcium: 9.6 mg/dL (ref 8.7–10.3)
Chloride: 103 mmol/L (ref 96–106)
Creatinine, Ser: 0.6 mg/dL (ref 0.57–1.00)
Globulin, Total: 2.9 g/dL (ref 1.5–4.5)
Glucose: 133 mg/dL — ABNORMAL HIGH (ref 70–99)
Potassium: 4.6 mmol/L (ref 3.5–5.2)
Sodium: 140 mmol/L (ref 134–144)
Total Protein: 7.1 g/dL (ref 6.0–8.5)
eGFR: 94 mL/min/{1.73_m2} (ref >59)

## 2024-03-05 LAB — TSH: TSH: 1.7 u[IU]/mL (ref 0.450–4.500)

## 2024-03-05 LAB — LIPID PANEL
Chol/HDL Ratio: 2.6 ratio (ref 0.0–4.4)
Cholesterol, Total: 154 mg/dL (ref 100–199)
HDL: 59 mg/dL (ref >39)
LDL Chol Calc (NIH): 65 mg/dL (ref 0–99)
Triglycerides: 180 mg/dL — ABNORMAL HIGH (ref 0–149)
VLDL Cholesterol Cal: 30 mg/dL (ref 5–40)

## 2024-03-05 LAB — HEMOGLOBIN A1C
Est. average glucose Bld gHb Est-mCnc: 183 mg/dL
Hgb A1c MFr Bld: 8 % — ABNORMAL HIGH (ref 4.8–5.6)

## 2024-03-05 MED ORDER — SITAGLIPTIN PHOSPHATE 100 MG PO TABS
100.0000 mg | ORAL_TABLET | Freq: Every day | ORAL | 1 refills | Status: DC
Start: 2024-03-05 — End: 2024-03-08

## 2024-03-08 ENCOUNTER — Encounter: Payer: Self-pay | Admitting: Internal Medicine

## 2024-03-08 ENCOUNTER — Other Ambulatory Visit: Payer: Self-pay | Admitting: Internal Medicine

## 2024-03-08 DIAGNOSIS — E118 Type 2 diabetes mellitus with unspecified complications: Secondary | ICD-10-CM

## 2024-03-08 MED ORDER — GLIMEPIRIDE 4 MG PO TABS
4.0000 mg | ORAL_TABLET | Freq: Every day | ORAL | 1 refills | Status: DC
Start: 2024-03-08 — End: 2024-08-30

## 2024-03-08 NOTE — Progress Notes (Unsigned)
 Date:  03/08/2024   Name:  Suzanne Richardson   DOB:  1951-08-28   MRN:  [id number]   Chief Complaint: No chief complaint on file.  HPI  Review of Systems   Lab Results  Component Value Date   NA 140 03/04/2024   K 4.6 03/04/2024   CO2 22 03/04/2024   GLUCOSE 133 (H) 03/04/2024   BUN 15 03/04/2024   CREATININE 0.60 03/04/2024   CALCIUM 9.6 03/04/2024   EGFR 94 03/04/2024   GFRNONAA 93 02/05/2021   Lab Results  Component Value Date   CHOL 154 03/04/2024   HDL 59 03/04/2024   LDLCALC 65 03/04/2024   TRIG 180 (H) 03/04/2024   CHOLHDL 2.6 03/04/2024   Lab Results  Component Value Date   TSH 1.700 03/04/2024   Lab Results  Component Value Date   HGBA1C 8.0 (H) 03/04/2024   Lab Results  Component Value Date   WBC 11.0 (H) 03/04/2024   HGB 13.0 03/04/2024   HCT 39.2 03/04/2024   MCV 89 03/04/2024   PLT 351 03/04/2024   Lab Results  Component Value Date   ALT 19 03/04/2024   AST 16 03/04/2024   ALKPHOS 88 03/04/2024   BILITOT <0.2 03/04/2024   No results found for: "25OHVITD2", "25OHVITD3", "VD25OH"   Patient Active Problem List   Diagnosis Date Noted   Restless leg syndrome 05/29/2023   Centrilobular emphysema (HCC) 09/13/2021   Pulmonary nodule 1 cm or greater in diameter 09/13/2021   Aortic atherosclerosis (HCC) 07/10/2021   Hyperlipidemia associated with type 2 diabetes mellitus (HCC) 05/08/2021   Type II diabetes mellitus with complication (HCC) 02/05/2021   Tobacco use disorder, moderate, in sustained remission 02/05/2021    No Known Allergies  Past Surgical History:  Procedure Laterality Date   CATARACT EXTRACTION W/PHACO Left 07/15/2019   Procedure: CATARACT EXTRACTION PHACO AND INTRAOCULAR LENS PLACEMENT (IOC) LEFT;  Surgeon: Nevada Crane, MD;  Location: [facility];  Service: Ophthalmology;  Laterality: Left;   CATARACT EXTRACTION W/PHACO Right 08/08/2019   Procedure: CATARACT EXTRACTION PHACO AND INTRAOCULAR LENS PLACEMENT (IOC)  RIGHT;  Surgeon: Nevada Crane, MD;  Location: [facility];  Service: Ophthalmology;  Laterality: Right;    Social History   Tobacco Use   Smoking status: Former    Current packs/day: 0.00    Average packs/day: 0.8 packs/day for 60.0 years (48.0 ttl pk-yrs)    Types: Cigarettes    Quit date: 07/30/2023    Years since quitting: 0.6   Smokeless tobacco: Never   Tobacco comments:    Quit in 08/06/2023. Smoked 1PPD for 60 years.  Vaping Use   Vaping status: Never Used  Substance Use Topics   Alcohol use: Not Currently   Drug use: Never     Medication list has been reviewed and updated.  No outpatient medications have been marked as taking for the 03/08/24 encounter (Orders Only) with Reubin Milan, MD.       03/04/2024   10:45 AM 10/07/2023    9:56 AM 05/29/2023    9:20 AM 01/20/2023    9:39 AM  GAD 7 : Generalized Anxiety Score  Nervous, Anxious, on Edge 0 0 0 0  Control/stop worrying 0 0 0 0  Worry too much - different things 0 0 0 0  Trouble relaxing 0 0 0 0  Restless 0 0 0 0  Easily annoyed or irritable 0 0 0 0  Afraid - awful might happen 0 0 0  0  Total GAD 7 Score 0 0 0 0  Anxiety Difficulty Not difficult at all Not difficult at all Not difficult at all Not difficult at all       03/04/2024   10:45 AM 10/07/2023    9:56 AM 05/29/2023    9:20 AM  Depression screen PHQ 2/9  Decreased Interest 0 0 0  Down, Depressed, Hopeless 0 0 0  PHQ - 2 Score 0 0 0  Altered sleeping 0 2 1  Tired, decreased energy 2 1 2   Change in appetite 0 2 0  Feeling bad or failure about yourself  0 0 0  Trouble concentrating 0 0 0  Moving slowly or fidgety/restless 0 0 0  Suicidal thoughts 0 0 0  PHQ-9 Score 2 5 3   Difficult doing work/chores Not difficult at all Not difficult at all Not difficult at all    BP Readings from Last 3 Encounters:  03/04/24 112/72  10/08/23 138/84  10/07/23 118/68    Physical Exam  Wt Readings from Last 3 Encounters:  03/04/24 167 lb 4  oz (75.9 kg)  10/08/23 169 lb 3.2 oz (76.7 kg)  10/07/23 170 lb (77.1 kg)    There were no vitals taken for this visit.  Assessment and Plan:  Problem List Items Addressed This Visit       Unprioritized   Type II diabetes mellitus with complication (HCC)   Relevant Medications   glimepiride (AMARYL) 4 MG tablet    No follow-ups on file.    Reubin Milan, MD [facility] [place]

## 2024-03-17 DIAGNOSIS — E119 Type 2 diabetes mellitus without complications: Secondary | ICD-10-CM | POA: Diagnosis not present

## 2024-03-17 LAB — HM DIABETES EYE EXAM

## 2024-03-19 ENCOUNTER — Other Ambulatory Visit: Payer: Self-pay | Admitting: Internal Medicine

## 2024-03-19 DIAGNOSIS — G2581 Restless legs syndrome: Secondary | ICD-10-CM

## 2024-03-21 NOTE — Telephone Encounter (Signed)
 Requested Prescriptions  Pending Prescriptions Disp Refills   rOPINIRole (REQUIP) 0.25 MG tablet [Pharmacy Med Name: rOPINIRole HCl 0.25 MG Oral Tablet] 90 tablet 1    Sig: TAKE 1 TABLET BY MOUTH AT BEDTIME AS NEEDED     Neurology:  Parkinsonian Agents Passed - 03/21/2024  1:38 PM      Passed - Last BP in normal range    BP Readings from Last 1 Encounters:  03/04/24 112/72         Passed - Last Heart Rate in normal range    Pulse Readings from Last 1 Encounters:  03/04/24 88         Passed - Valid encounter within last 12 months    Recent Outpatient Visits           5 months ago Diabetes mellitus treated with oral medication (HCC)   [place] [facility] at [facility], Nyoka Cowden, MD   9 months ago Diabetes mellitus treated with oral medication [facility])   [place] [facility] at [facility], Nyoka Cowden, MD   1 year ago Type II diabetes mellitus with complication [facility])   [place] [facility] at [facility], Nyoka Cowden, MD   1 year ago Type II diabetes mellitus with complication [facility])   [place] [facility] at [facility], Nyoka Cowden, MD   1 year ago Type II diabetes mellitus with complication [facility])   [place] [facility] at [facility], Nyoka Cowden, MD       Future Appointments             In 3 months Judithann Graves, Nyoka Cowden, MD [facility] at [facility], [facility]

## 2024-04-04 ENCOUNTER — Other Ambulatory Visit: Payer: Self-pay | Admitting: Internal Medicine

## 2024-04-04 ENCOUNTER — Telehealth: Payer: Self-pay | Admitting: Internal Medicine

## 2024-04-04 DIAGNOSIS — E119 Type 2 diabetes mellitus without complications: Secondary | ICD-10-CM

## 2024-04-04 NOTE — Telephone Encounter (Signed)
 Copied from CRM [id number]. Topic: Medicare AWV >> Apr 04, 2024 10:54 AM Payton Doughty wrote: Reason for CRM: Called LVM 04/04/2024 to schedule AWV. Please schedule Virtual or Telehealth visits ONLY.   Verlee Rossetti; Care Guide Ambulatory Clinical Support [place] [facility] Direct Dial: [phone]

## 2024-04-05 ENCOUNTER — Other Ambulatory Visit: Payer: Self-pay | Admitting: Internal Medicine

## 2024-04-05 DIAGNOSIS — E1169 Type 2 diabetes mellitus with other specified complication: Secondary | ICD-10-CM

## 2024-04-05 NOTE — Telephone Encounter (Signed)
 Requested Prescriptions  Pending Prescriptions Disp Refills   metFORMIN (GLUCOPHAGE-XR) 500 MG 24 hr tablet [Pharmacy Med Name: metFORMIN HCl ER 500 MG Oral Tablet Extended Release 24 Hour] 360 tablet 0    Sig: TAKE 2 TABLETS BY MOUTH TWICE DAILY WITH  A  MEAL     Endocrinology:  Diabetes - Biguanides Failed - 04/05/2024 11:21 AM      Failed - HBA1C is between 0 and 7.9 and within 180 days    Hgb A1c MFr Bld  Date Value Ref Range Status  03/04/2024 8.0 (H) 4.8 - 5.6 % Final    Comment:             Prediabetes: 5.7 - 6.4          Diabetes: >6.4          Glycemic control for adults with diabetes: <7.0          Failed - B12 Level in normal range and within 720 days    No results found for: "VITAMINB12"       Passed - Cr in normal range and within 360 days    Creatinine, Ser  Date Value Ref Range Status  03/04/2024 0.60 0.57 - 1.00 mg/dL Final         Passed - eGFR in normal range and within 360 days    GFR calc Af Amer  Date Value Ref Range Status  02/05/2021 107 >59 mL/min/1.73 Final    Comment:    **In accordance with recommendations from the NKF-ASN Task force,**   [facility] is in the process of updating its eGFR calculation to the   2021 CKD-EPI creatinine equation that estimates kidney function   without a race variable.    GFR calc non Af Amer  Date Value Ref Range Status  02/05/2021 93 >59 mL/min/1.73 Final   eGFR  Date Value Ref Range Status  03/04/2024 94 >59 mL/min/1.73 Final         Passed - Valid encounter within last 6 months    Recent Outpatient Visits           1 month ago Diabetes mellitus treated with oral medication [facility])   [place] [facility] at [facility], Nyoka Cowden, MD       Future Appointments             In 3 months Judithann Graves Nyoka Cowden, MD [facility] Primary Care & Sports Medicine at [facility], [facility]            Passed - CBC within normal limits and completed in the last 12 months    WBC  Date  Value Ref Range Status  03/04/2024 11.0 (H) 3.4 - 10.8 x10E3/uL Final   RBC  Date Value Ref Range Status  03/04/2024 4.40 3.77 - 5.28 x10E6/uL Final   Hemoglobin  Date Value Ref Range Status  03/04/2024 13.0 11.1 - 15.9 g/dL Final   Hematocrit  Date Value Ref Range Status  03/04/2024 39.2 34.0 - 46.6 % Final   MCHC  Date Value Ref Range Status  03/04/2024 33.2 31.5 - 35.7 g/dL Final   [facility]  Date Value Ref Range Status  03/04/2024 29.5 26.6 - 33.0 pg Final   MCV  Date Value Ref Range Status  03/04/2024 89 79 - 97 fL Final   No results found for: "PLTCOUNTKUC", "LABPLAT", "POCPLA" RDW  Date Value Ref Range Status  03/04/2024 14.5 11.7 - 15.4 % Final

## 2024-04-06 NOTE — Telephone Encounter (Signed)
 Requested Prescriptions  Pending Prescriptions Disp Refills   atorvastatin (LIPITOR) 10 MG tablet [Pharmacy Med Name: Atorvastatin Calcium 10 MG Oral Tablet] 90 tablet 0    Sig: Take 1 tablet by mouth once daily     Cardiovascular:  Antilipid - Statins Failed - 04/06/2024  9:30 AM      Failed - Lipid Panel in normal range within the last 12 months    Cholesterol, Total  Date Value Ref Range Status  03/04/2024 154 100 - 199 mg/dL Final   LDL Chol Calc (NIH)  Date Value Ref Range Status  03/04/2024 65 0 - 99 mg/dL Final   HDL  Date Value Ref Range Status  03/04/2024 59 >39 mg/dL Final   Triglycerides  Date Value Ref Range Status  03/04/2024 180 (H) 0 - 149 mg/dL Final         Passed - Patient is not pregnant      Passed - Valid encounter within last 12 months    Recent Outpatient Visits           1 month ago Diabetes mellitus treated with oral medication [facility])   [place] [facility] Medicine at [facility], Nyoka Cowden, MD       Future Appointments             In 2 months Judithann Graves, Nyoka Cowden, MD [facility] Primary [facility] Medicine at [facility], [facility]

## 2024-05-17 ENCOUNTER — Other Ambulatory Visit: Payer: Self-pay | Admitting: Internal Medicine

## 2024-05-17 DIAGNOSIS — E118 Type 2 diabetes mellitus with unspecified complications: Secondary | ICD-10-CM

## 2024-05-18 NOTE — Telephone Encounter (Signed)
 Refilled 03/08/24 # 90 with 1 refill. Requested Prescriptions  Refused Prescriptions Disp Refills   glimepiride  (AMARYL ) 2 MG tablet [Pharmacy Med Name: Glimepiride  2 MG Oral Tablet] 90 tablet 0    Sig: TAKE 1 TABLET BY MOUTH ONCE DAILY BEFORE BREAKFAST     There is no refill protocol information for this order

## 2024-05-31 ENCOUNTER — Telehealth: Payer: Self-pay | Admitting: Pulmonary Disease

## 2024-05-31 NOTE — Telephone Encounter (Signed)
 Patient states needs patient assistance for Breztri . Patient phone number is [phone].

## 2024-05-31 NOTE — Telephone Encounter (Signed)
 Lmtcb

## 2024-06-01 NOTE — Telephone Encounter (Signed)
 Patient advised and requested for us  to print out the form. Will mail out form for her. Confirmed address. NFN.

## 2024-06-28 ENCOUNTER — Other Ambulatory Visit: Payer: Self-pay | Admitting: Internal Medicine

## 2024-06-28 DIAGNOSIS — E119 Type 2 diabetes mellitus without complications: Secondary | ICD-10-CM

## 2024-06-29 NOTE — Telephone Encounter (Signed)
 Requested Prescriptions  Pending Prescriptions Disp Refills   metFORMIN  (GLUCOPHAGE -XR) 500 MG 24 hr tablet [Pharmacy Med Name: metFORMIN  HCl ER 500 MG Oral Tablet Extended Release 24 Hour] 360 tablet 0    Sig: TAKE 2 TABLETS BY MOUTH TWICE DAILY WITH A MEAL     Endocrinology:  Diabetes - Biguanides Failed - 06/29/2024  9:32 PM      Failed - HBA1C is between 0 and 7.9 and within 180 days    Hgb A1c MFr Bld  Date Value Ref Range Status  03/04/2024 8.0 (H) 4.8 - 5.6 % Final    Comment:             Prediabetes: 5.7 - 6.4          Diabetes: >6.4          Glycemic control for adults with diabetes: <7.0          Failed - B12 Level in normal range and within 720 days    No results found for: VITAMINB12       Passed - Cr in normal range and within 360 days    Creatinine, Ser  Date Value Ref Range Status  03/04/2024 0.60 0.57 - 1.00 mg/dL Final         Passed - eGFR in normal range and within 360 days    GFR calc Af Amer  Date Value Ref Range Status  02/05/2021 107 >59 mL/min/1.73 Final    Comment:    **In accordance with recommendations from the NKF-ASN Task force,**   [facility] is in the process of updating its eGFR calculation to the   2021 CKD-EPI creatinine equation that estimates kidney function   without a race variable.    GFR calc non Af Amer  Date Value Ref Range Status  02/05/2021 93 >59 mL/min/1.73 Final   eGFR  Date Value Ref Range Status  03/04/2024 94 >59 mL/min/1.73 Final         Passed - Valid encounter within last 6 months    Recent Outpatient Visits           3 months ago Diabetes mellitus treated with oral medication [facility])   [place] [facility] at [facility], Leita DEL, MD       Future Appointments             In 5 days Justus Leita DEL, MD [facility] Primary Care & Sports Medicine at [facility], [facility]            Passed - CBC within normal limits and completed in the last 12 months    WBC  Date Value  Ref Range Status  03/04/2024 11.0 (H) 3.4 - 10.8 x10E3/uL Final   RBC  Date Value Ref Range Status  03/04/2024 4.40 3.77 - 5.28 x10E6/uL Final   Hemoglobin  Date Value Ref Range Status  03/04/2024 13.0 11.1 - 15.9 g/dL Final   Hematocrit  Date Value Ref Range Status  03/04/2024 39.2 34.0 - 46.6 % Final   MCHC  Date Value Ref Range Status  03/04/2024 33.2 31.5 - 35.7 g/dL Final   [facility]  Date Value Ref Range Status  03/04/2024 29.5 26.6 - 33.0 pg Final   MCV  Date Value Ref Range Status  03/04/2024 89 79 - 97 fL Final   No results found for: PLTCOUNTKUC, LABPLAT, POCPLA RDW  Date Value Ref Range Status  03/04/2024 14.5 11.7 - 15.4 % Final

## 2024-07-01 ENCOUNTER — Other Ambulatory Visit: Payer: Self-pay | Admitting: Internal Medicine

## 2024-07-01 DIAGNOSIS — E1169 Type 2 diabetes mellitus with other specified complication: Secondary | ICD-10-CM

## 2024-07-04 ENCOUNTER — Ambulatory Visit: Admitting: Internal Medicine

## 2024-07-05 NOTE — Telephone Encounter (Signed)
 Requested Prescriptions  Pending Prescriptions Disp Refills   atorvastatin  (LIPITOR) 10 MG tablet [Pharmacy Med Name: Atorvastatin  Calcium  10 MG Oral Tablet] 90 tablet 2    Sig: Take 1 tablet by mouth once daily     Cardiovascular:  Antilipid - Statins Failed - 07/05/2024 11:17 AM      Failed - Lipid Panel in normal range within the last 12 months    Cholesterol, Total  Date Value Ref Range Status  03/04/2024 154 100 - 199 mg/dL Final   LDL Chol Calc (NIH)  Date Value Ref Range Status  03/04/2024 65 0 - 99 mg/dL Final   HDL  Date Value Ref Range Status  03/04/2024 59 >39 mg/dL Final   Triglycerides  Date Value Ref Range Status  03/04/2024 180 (H) 0 - 149 mg/dL Final         Passed - Patient is not pregnant      Passed - Valid encounter within last 12 months    Recent Outpatient Visits           4 months ago Diabetes mellitus treated with oral medication [facility])   [place] [facility] Medicine at [facility], Leita DEL, MD

## 2024-07-06 ENCOUNTER — Ambulatory Visit
Admission: RE | Admit: 2024-07-06 | Discharge: 2024-07-06 | Disposition: A | Discharge status: Home / Self Care | Source: Ambulatory Visit | Attending: Acute Care | Admitting: Acute Care

## 2024-07-06 DIAGNOSIS — R911 Solitary pulmonary nodule: Secondary | ICD-10-CM | POA: Diagnosis not present

## 2024-07-06 DIAGNOSIS — I700 Atherosclerosis of aorta: Secondary | ICD-10-CM | POA: Diagnosis not present

## 2024-07-06 DIAGNOSIS — J432 Centrilobular emphysema: Secondary | ICD-10-CM | POA: Diagnosis not present

## 2024-07-11 ENCOUNTER — Telehealth: Payer: Self-pay

## 2024-07-11 NOTE — Telephone Encounter (Signed)
 Appt canceled last week due to weather for DM. Pls call and reschedule office visit with Dr Justus.  - Jamai Dolce M.

## 2024-07-13 ENCOUNTER — Encounter

## 2024-07-15 ENCOUNTER — Encounter: Payer: Self-pay | Admitting: Pulmonary Disease

## 2024-07-15 ENCOUNTER — Ambulatory Visit (INDEPENDENT_AMBULATORY_CARE_PROVIDER_SITE_OTHER): Admitting: Pulmonary Disease

## 2024-07-15 VITALS — BP 112/62 | HR 62 | Temp 97.4°F | Ht 66.0 in | Wt 164.2 lb

## 2024-07-15 DIAGNOSIS — J449 Chronic obstructive pulmonary disease, unspecified: Secondary | ICD-10-CM

## 2024-07-15 DIAGNOSIS — R911 Solitary pulmonary nodule: Secondary | ICD-10-CM

## 2024-07-15 DIAGNOSIS — F17210 Nicotine dependence, cigarettes, uncomplicated: Secondary | ICD-10-CM

## 2024-07-15 MED ORDER — ALBUTEROL SULFATE HFA 108 (90 BASE) MCG/ACT IN AERS
2.0000 | INHALATION_SPRAY | Freq: Four times a day (QID) | RESPIRATORY_TRACT | 2 refills | Status: DC | PRN
Start: 2024-07-15 — End: 2024-09-22

## 2024-07-15 NOTE — Progress Notes (Signed)
 Subjective:    Patient ID: Suzanne Richardson, female    DOB: 1951-09-23, 73 y.o.   MRN: [id number]  Patient Care Team: Justus Leita DEL, MD as PCP - General (Internal Medicine) Sharlot Agent, OD [facility])  Chief Complaint  Patient presents with   Cough    Persistent cough.  Patient has re-started smoking    BACKGROUND/INTERVAL: Patient is a 73 year old current smoker who presents for follow-up on the issue of abnormal lung cancer screening CT and cough/shortness of breath due to COPD.  HPI Discussed the use of AI scribe software for clinical note transcription with the patient, who gave verbal consent to proceed.  History of Present Illness   The patient presents for follow-up of abnormal lung cancer screening and stage one COPD.  The recent lung cancer screening scan has not yet been officially read by radiologists, but a preliminary review showed no immediate concerns. The patient is awaiting the official report.  She has stage one COPD and was previously given an inhaler for trial use but discontinued it due to a sore throat and pain (Breztri ).  She generally has no difficulties with dyspnea, experiencing shortness of breath only when outside in the heat and bending over.  She has not been using albuterol  hardly at all..  She smokes approximately a pack a day and had previously quit for three months but resumed smoking due to the proximity of tobacco stores to their usual shopping locations.  She does not endorse any other symptomatology.  We did discuss lung cancer screening CT and this was shown to the patient.  Suspect that the patient can go to yearly lung cancer screening.     DATA 10/01/2023 PFTs: FEV1 1.70 L or 72% predicted, FVC 2.39 L or 77% predicted, FEV1/FVC 71%.  Lung volumes normal.  Diffusion capacity moderately reduced.  No bronchodilator response.  Consistent with stage I-II COPD.  Review of Systems A 10 point review of systems was performed and it is as noted  above otherwise negative.   Patient Active Problem List   Diagnosis Date Noted   Restless leg syndrome 05/29/2023   Centrilobular emphysema (HCC) 09/13/2021   Pulmonary nodule 1 cm or greater in diameter 09/13/2021   Aortic atherosclerosis (HCC) 07/10/2021   Hyperlipidemia associated with type 2 diabetes mellitus (HCC) 05/08/2021   Type II diabetes mellitus with complication (HCC) 02/05/2021   Tobacco use disorder, moderate, in sustained remission 02/05/2021    Social History   Tobacco Use   Smoking status: Every Day    Current packs/day: 0.80    Average packs/day: 0.8 packs/day for 60.0 years (48.0 ttl pk-yrs)    Types: Cigarettes   Smokeless tobacco: Never   Tobacco comments:    Quit in 08/06/2023. Smoked 1PPD for 60 years. Restarted smoking January 2025.  Substance Use Topics   Alcohol use: Not Currently    No Known Allergies  Current Meds  Medication Sig   albuterol  (VENTOLIN  HFA) 108 (90 Base) MCG/ACT inhaler Inhale 2 puffs into the lungs every 6 (six) hours as needed for wheezing or shortness of breath.   atorvastatin  (LIPITOR) 10 MG tablet Take 1 tablet by mouth once daily   buPROPion  (WELLBUTRIN  SR) 150 MG 12 hr tablet Take 1 tablet by mouth twice daily   glimepiride  (AMARYL ) 4 MG tablet Take 1 tablet (4 mg total) by mouth daily before breakfast.   metFORMIN  (GLUCOPHAGE -XR) 500 MG 24 hr tablet TAKE 2 TABLETS BY MOUTH TWICE DAILY WITH A MEAL   Multiple  Vitamin (MULTIVITAMIN) tablet Take 1 tablet by mouth daily.   rOPINIRole  (REQUIP ) 0.25 MG tablet TAKE 1 TABLET BY MOUTH AT BEDTIME AS NEEDED     There is no immunization history on file for this patient.      Objective:     BP 112/62   Pulse 62   Temp (!) 97.4 F (36.3 C) (Oral)   Ht 5' 6 (1.676 m)   Wt 164 lb 3.2 oz (74.5 kg)   SpO2 96%   BMI 26.50 kg/m   SpO2: 96 %  GENERAL: Well-developed, overweight woman, no acute distress.  No conversational dyspnea. HEAD: Normocephalic, atraumatic.  EYES:  Pupils equal, round, reactive to light.  No scleral icterus.  MOUTH: Dentition intact, oral mucosa moist. NECK: Supple. No thyromegaly. Trachea midline. No JVD.  No adenopathy. PULMONARY: Good air entry bilaterally.  No adventitious sounds. CARDIOVASCULAR: S1 and S2. Regular rate and rhythm.  No rubs, murmurs or gallops heard. ABDOMEN: Benign. MUSCULOSKELETAL: No joint deformity, no clubbing, no edema.  NEUROLOGIC: No overt focal deficit, no gait disturbance noted, speech is fluent. SKIN: Intact,warm,dry. PSYCH: Mood and behavior normal.          Assessment & Plan:     ICD-10-CM   1. Stage 1 mild COPD by GOLD classification (HCC)  J44.9     2. Lung nodule seen on imaging study  R91.1     3. Tobacco dependence due to cigarettes  F17.210       No orders of the defined types were placed in this encounter.   Meds ordered this encounter  Medications   albuterol  (VENTOLIN  HFA) 108 (90 Base) MCG/ACT inhaler    Sig: Inhale 2 puffs into the lungs every 6 (six) hours as needed for wheezing or shortness of breath.    Dispense:  8 g    Refill:  2    Discussion:    Stage 1 COPD Stage 1 COPD with intermittent dyspnea, particularly when outside and bending over in the heat. Previous inhaler trial (Breztri ) resulted in pharyngitis and pain, leading to discontinuation. Current smoking habit of approximately one pack per day, despite previous cessation attempt for three months. Discussed the importance of smoking cessation and the challenges faced due to environmental triggers. - Prescribe albuterol  inhaler for use as needed for dyspnea or chest tightness. - Send prescription to pharmacy ensuring coverage. - Encourage smoking cessation and discuss challenges.  Lung inflammation Previous lung scan showed a focus of inflammation, which appears to have improved. Current scan reviewed shows no concerning findings, likely residual inflammation. Awaiting radiologist's final report for  confirmation. Presence of a chronic cyst in the lung, not indicative of malignancy at this time. - Review radiologist's report once available. - Consider transitioning to annual lung scans if radiologist concurs with current findings.      Advised if symptoms do not improve or worsen, to please contact office for sooner follow up or seek emergency care.    I spent 30 minutes of dedicated to the care of this patient on the date of this encounter to include pre-visit review of records, face-to-face time with the patient discussing conditions above, post visit ordering of testing, clinical documentation with the electronic health record, making appropriate referrals as documented, and communicating necessary findings to members of the patients care team.     C. Leita Sanders, MD Advanced Bronchoscopy PCCM [place] [facility]    *This note was generated using voice recognition software/Dragon and/or AI transcription program.  Despite best efforts  to proofread, errors can occur which can change the meaning. Any transcriptional errors that result from this process are unintentional and may not be fully corrected at the time of dictation.

## 2024-07-15 NOTE — Patient Instructions (Addendum)
 We reviewed your CT scan of the chest there did not seem to be any major changes from prior.  We will await the radiologist interpretation.  Please work on quitting smoking.  I have sent in a prescription for albuterol  which is an inhaler that you can use 2 puffs every 4-6 hours as needed for shortness of breath wheezing or cough.  We will see you in follow-up in 6 months time call sooner if any new issues arise or problems arise.  We will notify you if there are any changes on the interpretation.

## 2024-07-18 ENCOUNTER — Ambulatory Visit (INDEPENDENT_AMBULATORY_CARE_PROVIDER_SITE_OTHER): Admitting: Internal Medicine

## 2024-07-18 ENCOUNTER — Encounter: Payer: Self-pay | Admitting: Internal Medicine

## 2024-07-18 VITALS — BP 112/62 | HR 74 | Ht 66.0 in | Wt 164.2 lb

## 2024-07-18 DIAGNOSIS — Z7984 Long term (current) use of oral hypoglycemic drugs: Secondary | ICD-10-CM

## 2024-07-18 DIAGNOSIS — M25361 Other instability, right knee: Secondary | ICD-10-CM | POA: Diagnosis not present

## 2024-07-18 DIAGNOSIS — E118 Type 2 diabetes mellitus with unspecified complications: Secondary | ICD-10-CM | POA: Diagnosis not present

## 2024-07-18 LAB — POCT GLYCOSYLATED HEMOGLOBIN (HGB A1C): Hemoglobin A1C: 7 % — AB (ref 4.0–5.6)

## 2024-07-18 NOTE — Progress Notes (Signed)
 Date:  07/18/2024   Name:  Suzanne Richardson   DOB:  Jun 29, 1951   MRN:  [id number]   Chief Complaint: Diabetes  Diabetes She presents for her follow-up diabetic visit. She has type 2 diabetes mellitus. Her disease course has been improving. Pertinent negatives for hypoglycemia include no headaches or tremors. Pertinent negatives for diabetes include no chest pain, no fatigue, no polydipsia and no polyuria.  Knee Pain  The incident occurred more than 1 week ago. The injury mechanism was a fall (about three months ago). The pain is present in the right knee. The quality of the pain is described as aching. The patient is experiencing no pain. Associated symptoms include muscle weakness (buckling about once a week intermittently). Pertinent negatives include no numbness. She reports no foreign bodies present. The symptoms are aggravated by weight bearing. She has tried NSAIDs for the symptoms. The treatment provided significant relief.    Review of Systems  Constitutional:  Negative for appetite change, fatigue, fever and unexpected weight change.  HENT:  Negative for tinnitus and trouble swallowing.   Eyes:  Negative for visual disturbance.  Respiratory:  Negative for cough, chest tightness and shortness of breath.   Cardiovascular:  Negative for chest pain, palpitations and leg swelling.  Gastrointestinal:  Negative for abdominal pain.  Endocrine: Negative for polydipsia and polyuria.  Genitourinary:  Negative for dysuria and hematuria.  Musculoskeletal:  Positive for arthralgias and gait problem.  Neurological:  Negative for tremors, numbness and headaches.  Psychiatric/Behavioral:  Negative for dysphoric mood.      Lab Results  Component Value Date   NA 140 03/04/2024   K 4.6 03/04/2024   CO2 22 03/04/2024   GLUCOSE 133 (H) 03/04/2024   BUN 15 03/04/2024   CREATININE 0.60 03/04/2024   CALCIUM  9.6 03/04/2024   EGFR 94 03/04/2024   GFRNONAA 93 02/05/2021   Lab Results  Component  Value Date   CHOL 154 03/04/2024   HDL 59 03/04/2024   LDLCALC 65 03/04/2024   TRIG 180 (H) 03/04/2024   CHOLHDL 2.6 03/04/2024   Lab Results  Component Value Date   TSH 1.700 03/04/2024   Lab Results  Component Value Date   HGBA1C 7.0 (A) 07/18/2024   Lab Results  Component Value Date   WBC 11.0 (H) 03/04/2024   HGB 13.0 03/04/2024   HCT 39.2 03/04/2024   MCV 89 03/04/2024   PLT 351 03/04/2024   Lab Results  Component Value Date   ALT 19 03/04/2024   AST 16 03/04/2024   ALKPHOS 88 03/04/2024   BILITOT <0.2 03/04/2024   No results found for: MARIEN BOLLS, VD25OH   Patient Active Problem List   Diagnosis Date Noted   Knee instability, right 07/18/2024   Restless leg syndrome 05/29/2023   Centrilobular emphysema (HCC) 09/13/2021   Pulmonary nodule 1 cm or greater in diameter 09/13/2021   Aortic atherosclerosis (HCC) 07/10/2021   Hyperlipidemia associated with type 2 diabetes mellitus (HCC) 05/08/2021   Type II diabetes mellitus with complication (HCC) 02/05/2021   Tobacco use disorder, moderate, in sustained remission 02/05/2021    No Known Allergies  Past Surgical History:  Procedure Laterality Date   CATARACT EXTRACTION W/PHACO Left 07/15/2019   Procedure: CATARACT EXTRACTION PHACO AND INTRAOCULAR LENS PLACEMENT (IOC) LEFT;  Surgeon: Myrna Adine Anes, MD;  Location: [facility];  Service: Ophthalmology;  Laterality: Left;   CATARACT EXTRACTION W/PHACO Right 08/08/2019   Procedure: CATARACT EXTRACTION PHACO AND INTRAOCULAR LENS PLACEMENT (IOC) RIGHT;  Surgeon: Myrna Adine Anes, MD;  Location: [facility];  Service: Ophthalmology;  Laterality: Right;    Social History   Tobacco Use   Smoking status: Every Day    Current packs/day: 0.80    Average packs/day: 0.8 packs/day for 60.0 years (48.0 ttl pk-yrs)    Types: Cigarettes   Smokeless tobacco: Never   Tobacco comments:    Quit in 08/06/2023. Smoked 1PPD for 60 years.  Restarted smoking January 2025.  Vaping Use   Vaping status: Never Used  Substance Use Topics   Alcohol use: Not Currently   Drug use: Never     Medication list has been reviewed and updated.  Current Meds  Medication Sig   albuterol  (VENTOLIN  HFA) 108 (90 Base) MCG/ACT inhaler Inhale 2 puffs into the lungs every 6 (six) hours as needed for wheezing or shortness of breath.   atorvastatin  (LIPITOR) 10 MG tablet Take 1 tablet by mouth once daily   glimepiride  (AMARYL ) 4 MG tablet Take 1 tablet (4 mg total) by mouth daily before breakfast.   metFORMIN  (GLUCOPHAGE -XR) 500 MG 24 hr tablet TAKE 2 TABLETS BY MOUTH TWICE DAILY WITH A MEAL   Multiple Vitamin (MULTIVITAMIN) tablet Take 1 tablet by mouth daily.   rOPINIRole  (REQUIP ) 0.25 MG tablet TAKE 1 TABLET BY MOUTH AT BEDTIME AS NEEDED   [DISCONTINUED] buPROPion  (WELLBUTRIN  SR) 150 MG 12 hr tablet Take 1 tablet by mouth twice daily       07/18/2024   10:36 AM 03/04/2024   10:45 AM 10/07/2023    9:56 AM 05/29/2023    9:20 AM  GAD 7 : Generalized Anxiety Score  Nervous, Anxious, on Edge 0 0 0 0  Control/stop worrying 0 0 0 0  Worry too much - different things 0 0 0 0  Trouble relaxing 0 0 0 0  Restless 0 0 0 0  Easily annoyed or irritable 0 0 0 0  Afraid - awful might happen 0 0 0 0  Total GAD 7 Score 0 0 0 0  Anxiety Difficulty Not difficult at all Not difficult at all Not difficult at all Not difficult at all       07/18/2024   10:36 AM 03/04/2024   10:45 AM 10/07/2023    9:56 AM  Depression screen PHQ 2/9  Decreased Interest 0 0 0  Down, Depressed, Hopeless 0 0 0  PHQ - 2 Score 0 0 0  Altered sleeping 0 0 2  Tired, decreased energy 1 2 1   Change in appetite 0 0 2  Feeling bad or failure about yourself  0 0 0  Trouble concentrating 0 0 0  Moving slowly or fidgety/restless 0 0 0  Suicidal thoughts 0 0 0  PHQ-9 Score 1 2 5   Difficult doing work/chores Not difficult at all Not difficult at all Not difficult at all    BP  Readings from Last 3 Encounters:  07/18/24 112/62  07/15/24 112/62  03/04/24 112/72    Physical Exam Vitals and nursing note reviewed.  Constitutional:      General: She is not in acute distress.    Appearance: She is well-developed.  HENT:     Head: Normocephalic and atraumatic.  Cardiovascular:     Rate and Rhythm: Normal rate and regular rhythm.     Heart sounds: No murmur heard. Pulmonary:     Effort: Pulmonary effort is normal. No respiratory distress.     Breath sounds: Wheezing present. No rhonchi.  Musculoskeletal:  Right knee: Normal. No swelling, deformity, effusion, erythema or bony tenderness. Normal range of motion. No tenderness.     Left knee: Normal.     Right lower leg: No edema.     Left lower leg: No edema.  Skin:    General: Skin is warm and dry.     Capillary Refill: Capillary refill takes less than 2 seconds.     Findings: No rash.  Neurological:     Mental Status: She is alert and oriented to person, place, and time.  Psychiatric:        Mood and Affect: Mood normal.        Behavior: Behavior normal.     Wt Readings from Last 3 Encounters:  07/18/24 164 lb 3.2 oz (74.5 kg)  07/15/24 164 lb 3.2 oz (74.5 kg)  03/04/24 167 lb 4 oz (75.9 kg)    BP 112/62   Pulse 74   Ht 5' 6 (1.676 m)   Wt 164 lb 3.2 oz (74.5 kg)   SpO2 96%   BMI 26.50 kg/m   Assessment and Plan:  Problem List Items Addressed This Visit       Unprioritized   Type II diabetes mellitus with complication (HCC) - Primary   Blood sugars have been improving.  No recent hypoglycemic events requiring assistance. Currently medications are MTF and glimepiride . Lab Results  Component Value Date   HGBA1C 8.0 (H) 03/04/2024   Last visit glimepiride  was added. A1C today = 7.0 Will continue the same regimen.       Relevant Orders   POCT glycosylated hemoglobin (Hb A1C) (Completed)   Knee instability, right   Long standing intermittent symptoms; recent fall onto the knee  with pain made symptoms worse Unclear if the fall was caused by instability She has taken Aleve and her knee discomfort is much improved Would consider imaging and referral if worsening      Other Visit Diagnoses       Long term current use of oral hypoglycemic drug           No follow-ups on file.    Leita HILARIO Adie, MD [facility] [place]

## 2024-07-18 NOTE — Assessment & Plan Note (Signed)
 Long standing intermittent symptoms; recent fall onto the knee with pain made symptoms worse Unclear if the fall was caused by instability She has taken Aleve and her knee discomfort is much improved Would consider imaging and referral if worsening

## 2024-07-18 NOTE — Assessment & Plan Note (Addendum)
 Blood sugars have been improving.  No recent hypoglycemic events requiring assistance. Currently medications are MTF and glimepiride . Lab Results  Component Value Date   HGBA1C 8.0 (H) 03/04/2024   Last visit glimepiride  was added. A1C today = 7.0 Will continue the same regimen.

## 2024-07-20 ENCOUNTER — Ambulatory Visit (INDEPENDENT_AMBULATORY_CARE_PROVIDER_SITE_OTHER): Admitting: Emergency Medicine

## 2024-07-20 VITALS — Ht 66.0 in | Wt 165.0 lb

## 2024-07-20 DIAGNOSIS — Z0000 Encounter for general adult medical examination without abnormal findings: Secondary | ICD-10-CM

## 2024-07-20 NOTE — Patient Instructions (Signed)
 Suzanne Richardson , Thank you for taking time out of your busy schedule to complete your Annual Wellness Visit with me. I enjoyed our conversation and look forward to speaking with you again next year. I, as well as your care team,  appreciate your ongoing commitment to your health goals. Please review the following plan we discussed and let me know if I can assist you in the future. Your Game plan/ To Do List    Referrals: None  Follow up Visits: Next Medicare AWV with our clinical staff: 07/26/25 @ 8:50am (PHONE VISIT)   Have you seen your provider in the last 6 months (3 months if uncontrolled diabetes)? Yes Next Office Visit with your provider: 11/21/24 10:40am with Dr. Justus  Clinician Recommendations:  Aim for 30 minutes of exercise or brisk walking, 6-8 glasses of water, and 5 servings of fruits and vegetables each day.       This is a list of the screening recommended for you and due dates:  Health Maintenance  Topic Date Due   DTaP/Tdap/Td vaccine (1 - Tdap) Never done   Pneumococcal Vaccine for age over 47 (1 of 2 - PCV) Never done   Colon Cancer Screening  Never done   DEXA scan (bone density measurement)  Never done   Flu Shot  07/29/2024   Yearly kidney health urinalysis for diabetes  10/06/2024   Complete foot exam   10/06/2024   Hemoglobin A1C  01/18/2025   Yearly kidney function blood test for diabetes  03/04/2025   Eye exam for diabetics  03/17/2025   Screening for Lung Cancer  07/06/2025   Medicare Annual Wellness Visit  07/20/2025   Hepatitis C Screening  Completed   Hepatitis B Vaccine  Aged Out   HPV Vaccine  Aged Out   Meningitis B Vaccine  Aged Out   COVID-19 Vaccine  Discontinued   Zoster (Shingles) Vaccine  Discontinued    Advanced directives: (Declined) Advance directive discussed with you today. Even though you declined this today, please call our office should you change your mind, and we can give you the proper paperwork for you to fill out. Advance Care  Planning is important because it:  [x]  Makes sure you receive the medical care that is consistent with your values, goals, and preferences  [x]  It provides guidance to your family and loved ones and reduces their decisional burden about whether or not they are making the right decisions based on your wishes.  Follow the link provided in your after visit summary or read over the paperwork we have mailed to you to help you started getting your Advance Directives in place. If you need assistance in completing these, please reach out to us  so that we can help you!  See attachments for Preventive Care and Fall Prevention Tips.   Steps to Quit Smoking Smoking tobacco is the leading cause of preventable death. It can affect almost every organ in the body. Smoking puts you and those around you at risk for developing many serious chronic diseases. Quitting smoking can be very challenging. Do not get discouraged if you are not successful the first time. Some people need to make many attempts to quit before they achieve long-term success. Do your best to stick to your quit plan, and talk with your health care provider if you have any questions or concerns. How do I get ready to quit? When you decide to quit smoking, create a plan to help you succeed. Before you quit: Pick  a date to quit. Set a date within the next 2 weeks to give you time to prepare. Write down the reasons why you are quitting. Keep this list in places where you will see it often. Tell your family, friends, and co-workers that you are quitting. Support from people you are close to can make quitting easier. Talk with your health care provider about your options for quitting smoking. Find out what treatment options are covered by your health insurance. Identify people, places, things, and activities that make you want to smoke (triggers). Avoid them. What first steps can I take to quit smoking? Throw away all cigarettes at home, at work,  and in your car. Throw away smoking accessories, such as Set designer. Clean your car. Make sure to empty the ashtray. Clean your home, including curtains and carpets. What strategies can I use to quit smoking? Talk with your health care provider about combining strategies, such as taking medicines while you are also receiving in-person counseling. Using these two strategies together makes you more likely to succeed in quitting than if you used either strategy on its own. If you are pregnant or breastfeeding, talk with your health care provider about finding counseling or other support strategies to quit smoking. Do not take medicine to help you quit smoking unless your health care provider tells you to. Quit right away Quit smoking completely, instead of gradually reducing how much you smoke over a period of time. Stopping smoking right away may be more successful than gradually quitting. Attend in-person counseling to help you build problem-solving skills. You are more likely to succeed in quitting if you attend counseling sessions regularly. Even short sessions of 10 minutes can be effective. Take medicine You may take medicines to help you quit smoking. Some medicines require a prescription. You can also purchase over-the-counter medicines. Medicines may have nicotine in them to replace the nicotine in cigarettes. Medicines may: Help to stop cravings. Help to relieve withdrawal symptoms. Your health care provider may recommend: Nicotine patches, gum, or lozenges. Nicotine inhalers or sprays. Non-nicotine medicine that you take by mouth. Find resources Find resources and support systems that can help you quit smoking and remain smoke-free after you quit. These resources are most helpful when you use them often. They include: Online chats with a Veterinary surgeon. Telephone quitlines. Printed Materials engineer. Support groups or group counseling. Text messaging programs. Mobile phone  apps or applications. Use apps that can help you stick to your quit plan by providing reminders, tips, and encouragement. Examples of free services include Quit Guide from the CDC and smokefree.gov  What can I do to make it easier to quit?  Reach out to your family and friends for support and encouragement. Call telephone quitlines, such as 1-800-QUIT-NOW, reach out to support groups, or work with a counselor for support. Ask people who smoke to avoid smoking around you. Avoid places that trigger you to smoke, such as bars, parties, or smoke-break areas at work. Spend time with people who do not smoke. Lessen the stress in your life. Stress can be a smoking trigger for some people. To lessen stress, try: Exercising regularly. Doing deep-breathing exercises. Doing yoga. Meditating. What benefits will I see if I quit smoking? Over time, you should start to see positive results, such as: Improved sense of smell and taste. Decreased coughing and sore throat. Slower heart rate. Lower blood pressure. Clearer and healthier skin. The ability to breathe more easily. Fewer sick days. Summary Quitting smoking can  be very challenging. Do not get discouraged if you are not successful the first time. Some people need to make many attempts to quit before they achieve long-term success. When you decide to quit smoking, create a plan to help you succeed. Quit smoking right away, not slowly over a period of time. Find resources and support systems that can help you quit smoking and remain smoke-free after you quit. This information is not intended to replace advice given to you by your health care provider. Make sure you discuss any questions you have with your health care provider. Document Revised: 12/06/2021 Document Reviewed: 12/06/2021 Elsevier Patient Education  2024 [employer].   Fall Prevention in the Home, Adult Falls can cause injuries and affect people of all ages. There are many simple  things that you can do to make your home safe and to help prevent falls. If you need it, ask for help making these changes. What actions can I take to prevent falls? General information Use good lighting in all rooms. Make sure to: Replace any light bulbs that burn out. Turn on lights if it is dark and use night-lights. Keep items that you use often in easy-to-reach places. Lower the shelves around your home if needed. Move furniture so that there are clear paths around it. Do not keep throw rugs or other things on the floor that can make you trip. If any of your floors are uneven, fix them. Add color or contrast paint or tape to clearly mark and help you see: Grab bars or handrails. First and last steps of staircases. Where the edge of each step is. If you use a ladder or stepladder: Make sure that it is fully opened. Do not climb a closed ladder. Make sure the sides of the ladder are locked in place. Have someone hold the ladder while you use it. Know where your pets are as you move through your home. What can I do in the bathroom?     Keep the floor dry. Clean up any water that is on the floor right away. Remove soap buildup in the bathtub or shower. Buildup makes bathtubs and showers slippery. Use non-skid mats or decals on the floor of the bathtub or shower. Attach bath mats securely with double-sided, non-slip rug tape. If you need to sit down while you are in the shower, use a non-slip stool. Install grab bars by the toilet and in the bathtub and shower. Do not use towel bars as grab bars. What can I do in the bedroom? Make sure that you have a light by your bed that is easy to reach. Do not use any sheets or blankets on your bed that hang to the floor. Have a firm bench or chair with side arms that you can use for support when you get dressed. What can I do in the kitchen? Clean up any spills right away. If you need to reach something above you, use a sturdy step stool  that has a grab bar. Keep electrical cables out of the way. Do not use floor polish or wax that makes floors slippery. What can I do with my stairs? Do not leave anything on the stairs. Make sure that you have a light switch at the top and the bottom of the stairs. Have them installed if you do not have them. Make sure that there are handrails on both sides of the stairs. Fix handrails that are broken or loose. Make sure that handrails are as long  as the staircases. Install non-slip stair treads on all stairs in your home if they do not have carpet. Avoid having throw rugs at the top or bottom of stairs, or secure the rugs with carpet tape to prevent them from moving. Choose a carpet design that does not hide the edge of steps on the stairs. Make sure that carpet is firmly attached to the stairs. Fix any carpet that is loose or worn. What can I do on the outside of my home? Use bright outdoor lighting. Repair the edges of walkways and driveways and fix any cracks. Clear paths of anything that can make you trip, such as tools or rocks. Add color or contrast paint or tape to clearly mark and help you see high doorway thresholds. Trim any bushes or trees on the main path into your home. Check that handrails are securely fastened and in good repair. Both sides of all steps should have handrails. Install guardrails along the edges of any raised decks or porches. Have leaves, snow, and ice cleared regularly. Use sand, salt, or ice melt on walkways during winter months if you live where there is ice and snow. In the garage, clean up any spills right away, including grease or oil spills. What other actions can I take? Review your medicines with your health care provider. Some medicines can make you confused or feel dizzy. This can increase your chance of falling. Wear closed-toe shoes that fit well and support your feet. Wear shoes that have rubber soles and low heels. Use a cane, walker, scooter, or  crutches that help you move around if needed. Talk with your provider about other ways that you can decrease your risk of falls. This may include seeing a physical therapist to learn to do exercises to improve movement and strength. Where to find more information Centers for Disease Control and Prevention, STEADI: TonerPromos.no [employer] on Aging: BaseRingTones.pl National Institute on Aging: BaseRingTones.pl Contact a health care provider if: You are afraid of falling at home. You feel weak, drowsy, or dizzy at home. You fall at home. Get help right away if you: Lose consciousness or have trouble moving after a fall. Have a fall that causes a head injury. These symptoms may be an emergency. Get help right away. Call 911. Do not wait to see if the symptoms will go away. Do not drive yourself to the hospital. This information is not intended to replace advice given to you by your health care provider. Make sure you discuss any questions you have with your health care provider. Document Revised: 08/18/2022 Document Reviewed: 08/18/2022 Elsevier Patient Education  2024 [employer].

## 2024-07-20 NOTE — Progress Notes (Signed)
 Subjective:   Suzanne Richardson is a 73 y.o. who presents for a Medicare Wellness preventive visit.  As a reminder, Annual Wellness Visits don't include a physical exam, and some assessments may be limited, especially if this visit is performed virtually. We may recommend an in-person follow-up visit with your provider if needed.  Visit Complete: Virtual I connected with  Suzanne Richardson on 07/20/24 by a audio enabled telemedicine application and verified that I am speaking with the correct person using two identifiers.  Patient Location: Home  Provider Location: Home Office  I discussed the limitations of evaluation and management by telemedicine. The patient expressed understanding and agreed to proceed.  Vital Signs: Because this visit was a virtual/telehealth visit, some criteria may be missing or patient reported. Any vitals not documented were not able to be obtained and vitals that have been documented are patient reported.  VideoDeclined- This patient declined Librarian, academic. Therefore the visit was completed with audio only.  Persons Participating in Visit: Patient.  AWV Questionnaire: No: Patient Medicare AWV questionnaire was not completed prior to this visit.  Cardiac Risk Factors include: advanced age (>63men, >71 women);dyslipidemia;diabetes mellitus;smoking/ tobacco exposure     Objective:    Today's Vitals   07/20/24 0848  Weight: 165 lb (74.8 kg)  Height: 5' 6 (1.676 m)   Body mass index is 26.63 kg/m.     07/20/2024    8:58 AM 05/29/2023    9:27 AM 05/05/2022   10:21 AM 08/08/2019   10:32 AM 07/15/2019    9:40 AM  Advanced Directives  Does Patient Have a Medical Advance Directive? No No No No No  Would patient like information on creating a medical advance directive? No - Patient declined No - Patient declined Yes (MAU/Ambulatory/Procedural Areas - Information given) No - Patient declined  No - Patient declined      Data saved  with a previous flowsheet row definition    Current Medications (verified) Outpatient Encounter Medications as of 07/20/2024  Medication Sig   albuterol  (VENTOLIN  HFA) 108 (90 Base) MCG/ACT inhaler Inhale 2 puffs into the lungs every 6 (six) hours as needed for wheezing or shortness of breath.   atorvastatin  (LIPITOR) 10 MG tablet Take 1 tablet by mouth once daily   glimepiride  (AMARYL ) 4 MG tablet Take 1 tablet (4 mg total) by mouth daily before breakfast.   metFORMIN  (GLUCOPHAGE -XR) 500 MG 24 hr tablet TAKE 2 TABLETS BY MOUTH TWICE DAILY WITH A MEAL   Multiple Vitamin (MULTIVITAMIN) tablet Take 1 tablet by mouth daily.   rOPINIRole  (REQUIP ) 0.25 MG tablet TAKE 1 TABLET BY MOUTH AT BEDTIME AS NEEDED   No facility-administered encounter medications on file as of 07/20/2024.    Allergies (verified) Patient has no known allergies.   History: Past Medical History:  Diagnosis Date   Depression    Hyperlipidemia    Hypertension    Past Surgical History:  Procedure Laterality Date   CATARACT EXTRACTION W/PHACO Left 07/15/2019   Procedure: CATARACT EXTRACTION PHACO AND INTRAOCULAR LENS PLACEMENT (IOC) LEFT;  Surgeon: Myrna Adine Anes, MD;  Location: [facility];  Service: Ophthalmology;  Laterality: Left;   CATARACT EXTRACTION W/PHACO Right 08/08/2019   Procedure: CATARACT EXTRACTION PHACO AND INTRAOCULAR LENS PLACEMENT (IOC) RIGHT;  Surgeon: Myrna Adine Anes, MD;  Location: [facility];  Service: Ophthalmology;  Laterality: Right;   Family History  Problem Relation Age of Onset   Parkinson's disease Mother    Lung cancer Father  Social History   Socioeconomic History   Marital status: Married    Spouse name: Suzanne Richardson   Number of children: 2   Years of education: Not on file   Highest education level: Not on file  Occupational History   Occupation: retired  Tobacco Use   Smoking status: Every Day    Current packs/day: 1.00    Average packs/day: 1  pack/day for 58.2 years (58.2 ttl pk-yrs)    Types: Cigarettes    Start date: 19    Last attempt to quit: 08/06/2023   Smokeless tobacco: Never   Tobacco comments:    Quit in 08/06/2023. Smoked 1PPD for 60 years. Restarted smoking January 2025.  Vaping Use   Vaping status: Never Used  Substance and Sexual Activity   Alcohol use: Not Currently   Drug use: Never   Sexual activity: Yes    Birth control/protection: None  Other Topics Concern   Not on file  Social History Narrative   Not on file   Social Drivers of Health   Financial Resource Strain: Low Risk  (07/20/2024)   Overall Financial Resource Strain (CARDIA)    Difficulty of Paying Living Expenses: Not hard at all  Food Insecurity: No Food Insecurity (07/20/2024)   Hunger Vital Sign    Worried About Running Out of Food in the Last Year: Never true    Ran Out of Food in the Last Year: Never true  Transportation Needs: No Transportation Needs (07/20/2024)   PRAPARE - Administrator, Civil Service (Medical): No    Lack of Transportation (Non-Medical): No  Physical Activity: Inactive (07/20/2024)   Exercise Vital Sign    Days of Exercise per Week: 0 days    Minutes of Exercise per Session: 0 min  Stress: No Stress Concern Present (07/20/2024)   [employer] of Occupational Health - Occupational Stress Questionnaire    Feeling of Stress: Not at all  Social Connections: Moderately Isolated (07/20/2024)   Social Connection and Isolation Panel    Frequency of Communication with Friends and Family: Twice a week    Frequency of Social Gatherings with Friends and Family: Once a week    Attends Religious Services: Never    Database administrator or Organizations: No    Attends Banker Meetings: Never    Marital Status: Married    Tobacco Counseling Ready to quit: Not Answered Counseling given: Not Answered Tobacco comments: Quit in 08/06/2023. Smoked 1PPD for 60 years. Restarted smoking January  2025.    Clinical Intake:  Pre-visit preparation completed: Yes  Pain : No/denies pain     BMI - recorded: 26.63 Nutritional Status: BMI 25 -29 Overweight Nutritional Risks: None Diabetes: Yes CBG done?: No Did pt. bring in CBG monitor from home?: No  Lab Results  Component Value Date   HGBA1C 7.0 (A) 07/18/2024   HGBA1C 8.0 (H) 03/04/2024   HGBA1C 7.8 (A) 10/07/2023     How often do you need to have someone help you when you read instructions, pamphlets, or other written materials from your doctor or pharmacy?: 1 - Never  Interpreter Needed?: No  Information entered by :: Vina Debby, CMA   Activities of Daily Living     07/20/2024    8:49 AM  In your present state of health, do you have any difficulty performing the following activities:  Hearing? 0  Vision? 0  Difficulty concentrating or making decisions? 0  Walking or climbing stairs? 1  Comment  knee pain off & on, very cautious  Dressing or bathing? 0  Doing errands, shopping? 0  Preparing Food and eating ? N  Using the Toilet? N  In the past six months, have you accidently leaked urine? N  Do you have problems with loss of bowel control? N  Managing your Medications? N  Managing your Finances? N  Housekeeping or managing your Housekeeping? N    Patient Care Team: Justus Leita DEL, MD as PCP - General (Internal Medicine) Sharlot Agent, OD (Optometry) Tamea Dedra CROME, MD as Consulting Physician (Pulmonary Disease)  I have updated your Care Teams any recent Medical Services you may have received from other providers in the past year.     Assessment:   This is a routine wellness examination for Suzanne Richardson.  Hearing/Vision screen Hearing Screening - Comments:: Denies hearing loss  Vision Screening - Comments:: Gets DM eye exams, Dr. Agent Sharlot, [facility] [place]   Goals Addressed               This Visit's Progress     Quit Smoking (pt-stated)         Depression Screen     07/20/2024     8:56 AM 07/18/2024   10:36 AM 03/04/2024   10:45 AM 10/07/2023    9:56 AM 05/29/2023    9:20 AM 01/20/2023    9:39 AM 09/15/2022    9:56 AM  PHQ 2/9 Scores  PHQ - 2 Score 0 0 0 0 0 1 0  PHQ- 9 Score 1 1 2 5 3 3 2     Fall Risk     07/20/2024    8:59 AM 07/18/2024   10:36 AM 03/04/2024   10:45 AM 10/07/2023    9:56 AM 05/29/2023    9:20 AM  Fall Risk   Falls in the past year? 1 1 1 1  0  Number falls in past yr: 1 1 1  0 0  Injury with Fall? 0 0 0 0 0  Risk for fall due to : History of fall(s);Impaired balance/gait;Orthopedic patient No Fall Risks History of fall(s) History of fall(s) No Fall Risks  Follow up Falls evaluation completed;Education provided Falls evaluation completed Falls evaluation completed Falls evaluation completed Falls evaluation completed    MEDICARE RISK AT HOME:  Medicare Risk at Home Any stairs in or around the home?: Yes If so, are there any without handrails?: No Home free of loose throw rugs in walkways, pet beds, electrical cords, etc?: Yes Adequate lighting in your home to reduce risk of falls?: Yes Life alert?: No Use of a cane, walker or w/c?: No Grab bars in the bathroom?: Yes Shower chair or bench in shower?: Yes Elevated toilet seat or a handicapped toilet?: Yes  TIMED UP AND GO:  Was the test performed?  No  Cognitive Function: 6CIT completed        07/20/2024    9:00 AM  6CIT Screen  What Year? 0 points  What month? 0 points  What time? 0 points  Count back from 20 0 points  Months in reverse 0 points  Repeat phrase 0 points  Total Score 0 points    Immunizations  There is no immunization history on file for this patient.  Screening Tests Health Maintenance  Topic Date Due   DTaP/Tdap/Td (1 - Tdap) Never done   Pneumococcal Vaccine: 50+ Years (1 of 2 - PCV) Never done   Colonoscopy  Never done   DEXA SCAN  Never done  INFLUENZA VACCINE  07/29/2024   Diabetic kidney evaluation - Urine ACR  10/06/2024   FOOT EXAM   10/06/2024   HEMOGLOBIN A1C  01/18/2025   Diabetic kidney evaluation - eGFR measurement  03/04/2025   OPHTHALMOLOGY EXAM  03/17/2025   Lung Cancer Screening  07/06/2025   Medicare Annual Wellness (AWV)  07/20/2025   Hepatitis C Screening  Completed   Hepatitis B Vaccines  Aged Out   HPV VACCINES  Aged Out   Meningococcal B Vaccine  Aged Out   COVID-19 Vaccine  Discontinued   Zoster Vaccines- Shingrix  Discontinued    Health Maintenance  Health Maintenance Due  Topic Date Due   DTaP/Tdap/Td (1 - Tdap) Never done   Pneumococcal Vaccine: 50+ Years (1 of 2 - PCV) Never done   Colonoscopy  Never done   DEXA SCAN  Never done   Health Maintenance Items Addressed: See Nurse Notes at the end of this note  Additional Screening:  Vision Screening: Recommended annual ophthalmology exams for early detection of glaucoma and other disorders of the eye. Would you like a referral to an eye doctor? No    Dental Screening: Recommended annual dental exams for proper oral hygiene  Community Resource Referral / Chronic Care Management: CRR required this visit?  No   CCM required this visit?  No   Plan:    I have personally reviewed and noted the following in the patient's chart:   Medical and social history Use of alcohol, tobacco or illicit drugs  Current medications and supplements including opioid prescriptions. Patient is not currently taking opioid prescriptions. Functional ability and status Nutritional status Physical activity Advanced directives List of other physicians Hospitalizations, surgeries, and ER visits in previous 12 months Vitals Screenings to include cognitive, depression, and falls Referrals and appointments  In addition, I have reviewed and discussed with patient certain preventive protocols, quality metrics, and best practice recommendations. A written personalized care plan for preventive services as well as general preventive health recommendations were  provided to patient.   Vina Ned, CMA   07/20/2024   After Visit Summary: (MyChart) Due to this being a telephonic visit, the after visit summary with patients personalized plan was offered to patient via MyChart   Notes:  Declined DM & Nutrition education referral Declined all immunizations Declined MMG, DEXA and screening colonoscopy

## 2024-08-11 ENCOUNTER — Other Ambulatory Visit: Payer: Self-pay | Admitting: Acute Care

## 2024-08-11 DIAGNOSIS — Z87891 Personal history of nicotine dependence: Secondary | ICD-10-CM

## 2024-08-11 DIAGNOSIS — F17210 Nicotine dependence, cigarettes, uncomplicated: Secondary | ICD-10-CM

## 2024-08-11 DIAGNOSIS — Z122 Encounter for screening for malignant neoplasm of respiratory organs: Secondary | ICD-10-CM

## 2024-08-30 ENCOUNTER — Other Ambulatory Visit: Payer: Self-pay | Admitting: Internal Medicine

## 2024-08-30 DIAGNOSIS — E118 Type 2 diabetes mellitus with unspecified complications: Secondary | ICD-10-CM

## 2024-08-30 NOTE — Telephone Encounter (Signed)
 Requested Prescriptions  Pending Prescriptions Disp Refills   glimepiride  (AMARYL ) 4 MG tablet [Pharmacy Med Name: Glimepiride  4 MG Oral Tablet] 90 tablet 0    Sig: TAKE 1 TABLET BY MOUTH ONCE DAILY BEFORE BREAKFAST     Endocrinology:  Diabetes - Sulfonylureas Passed - 08/30/2024  3:46 PM      Passed - HBA1C is between 0 and 7.9 and within 180 days    Hemoglobin A1C  Date Value Ref Range Status  07/18/2024 7.0 (A) 4.0 - 5.6 % Final   Hgb A1c MFr Bld  Date Value Ref Range Status  03/04/2024 8.0 (H) 4.8 - 5.6 % Final    Comment:             Prediabetes: 5.7 - 6.4          Diabetes: >6.4          Glycemic control for adults with diabetes: <7.0          Passed - Cr in normal range and within 360 days    Creatinine, Ser  Date Value Ref Range Status  03/04/2024 0.60 0.57 - 1.00 mg/dL Final         Passed - Valid encounter within last 6 months    Recent Outpatient Visits           1 month ago Type II diabetes mellitus with complication [facility])   [place] [facility], Leita DEL, MD   5 months ago Diabetes mellitus treated with oral medication [facility])   [place] [facility] at [facility], Leita DEL, MD

## 2024-09-13 ENCOUNTER — Other Ambulatory Visit: Payer: Self-pay | Admitting: Internal Medicine

## 2024-09-13 DIAGNOSIS — G2581 Restless legs syndrome: Secondary | ICD-10-CM

## 2024-09-14 NOTE — Telephone Encounter (Signed)
 Requested Prescriptions  Pending Prescriptions Disp Refills   rOPINIRole  (REQUIP ) 0.25 MG tablet [Pharmacy Med Name: rOPINIRole  HCl 0.25 MG Oral Tablet] 90 tablet 0    Sig: TAKE 1 TABLET BY MOUTH AT BEDTIME AS NEEDED     Neurology:  Parkinsonian Agents Passed - 09/14/2024  2:42 PM      Passed - Last BP in normal range    BP Readings from Last 1 Encounters:  07/18/24 112/62         Passed - Last Heart Rate in normal range    Pulse Readings from Last 1 Encounters:  07/18/24 74         Passed - Valid encounter within last 12 months    Recent Outpatient Visits           1 month ago Type II diabetes mellitus with complication [facility])   [place] [facility] at [facility], Leita DEL, MD   6 months ago Diabetes mellitus treated with oral medication [facility])   [place] [facility] at [facility], Leita DEL, MD

## 2024-09-22 ENCOUNTER — Other Ambulatory Visit: Payer: Self-pay | Admitting: Pulmonary Disease

## 2024-09-22 DIAGNOSIS — J449 Chronic obstructive pulmonary disease, unspecified: Secondary | ICD-10-CM

## 2024-09-25 ENCOUNTER — Other Ambulatory Visit: Payer: Self-pay | Admitting: Internal Medicine

## 2024-09-25 DIAGNOSIS — E119 Type 2 diabetes mellitus without complications: Secondary | ICD-10-CM

## 2024-09-27 NOTE — Telephone Encounter (Signed)
 Requested Prescriptions  Pending Prescriptions Disp Refills   metFORMIN  (GLUCOPHAGE -XR) 500 MG 24 hr tablet [Pharmacy Med Name: metFORMIN  HCl ER 500 MG Oral Tablet Extended Release 24 Hour] 360 tablet 0    Sig: TAKE 2 TABLETS BY MOUTH TWICE DAILY WITH A MEAL     Endocrinology:  Diabetes - Biguanides Failed - 09/27/2024  1:36 PM      Failed - B12 Level in normal range and within 720 days    No results found for: VITAMINB12       Passed - Cr in normal range and within 360 days    Creatinine, Ser  Date Value Ref Range Status  03/04/2024 0.60 0.57 - 1.00 mg/dL Final         Passed - HBA1C is between 0 and 7.9 and within 180 days    Hemoglobin A1C  Date Value Ref Range Status  07/18/2024 7.0 (A) 4.0 - 5.6 % Final   Hgb A1c MFr Bld  Date Value Ref Range Status  03/04/2024 8.0 (H) 4.8 - 5.6 % Final    Comment:             Prediabetes: 5.7 - 6.4          Diabetes: >6.4          Glycemic control for adults with diabetes: <7.0          Passed - eGFR in normal range and within 360 days    GFR calc Af Amer  Date Value Ref Range Status  02/05/2021 107 >59 mL/min/1.73 Final    Comment:    **In accordance with recommendations from the NKF-ASN Task force,**   [facility] is in the process of updating its eGFR calculation to the   2021 CKD-EPI creatinine equation that estimates kidney function   without a race variable.    GFR calc non Af Amer  Date Value Ref Range Status  02/05/2021 93 >59 mL/min/1.73 Final   eGFR  Date Value Ref Range Status  03/04/2024 94 >59 mL/min/1.73 Final         Passed - Valid encounter within last 6 months    Recent Outpatient Visits           2 months ago Type II diabetes mellitus with complication [facility])   [place] [facility], Leita DEL, MD   6 months ago Diabetes mellitus treated with oral medication [facility])   [place] [facility] at [facility], Leita DEL, MD               Passed - CBC within normal limits and completed in the last 12 months    WBC  Date Value Ref Range Status  03/04/2024 11.0 (H) 3.4 - 10.8 x10E3/uL Final   RBC  Date Value Ref Range Status  03/04/2024 4.40 3.77 - 5.28 x10E6/uL Final   Hemoglobin  Date Value Ref Range Status  03/04/2024 13.0 11.1 - 15.9 g/dL Final   Hematocrit  Date Value Ref Range Status  03/04/2024 39.2 34.0 - 46.6 % Final   MCHC  Date Value Ref Range Status  03/04/2024 33.2 31.5 - 35.7 g/dL Final   [facility]  Date Value Ref Range Status  03/04/2024 29.5 26.6 - 33.0 pg Final   MCV  Date Value Ref Range Status  03/04/2024 89 79 - 97 fL Final   No results found for: PLTCOUNTKUC, LABPLAT, POCPLA RDW  Date Value Ref Range Status  03/04/2024  14.5 11.7 - 15.4 % Final

## 2024-11-21 ENCOUNTER — Encounter: Payer: Self-pay | Admitting: Internal Medicine

## 2024-11-21 ENCOUNTER — Ambulatory Visit (INDEPENDENT_AMBULATORY_CARE_PROVIDER_SITE_OTHER): Admitting: Internal Medicine

## 2024-11-21 VITALS — BP 110/68 | HR 68 | Ht 66.0 in | Wt 161.0 lb

## 2024-11-21 DIAGNOSIS — E785 Hyperlipidemia, unspecified: Secondary | ICD-10-CM | POA: Diagnosis not present

## 2024-11-21 DIAGNOSIS — E118 Type 2 diabetes mellitus with unspecified complications: Secondary | ICD-10-CM | POA: Diagnosis not present

## 2024-11-21 DIAGNOSIS — E1169 Type 2 diabetes mellitus with other specified complication: Secondary | ICD-10-CM

## 2024-11-21 DIAGNOSIS — Z7984 Long term (current) use of oral hypoglycemic drugs: Secondary | ICD-10-CM

## 2024-11-21 LAB — POCT GLYCOSYLATED HEMOGLOBIN (HGB A1C): Hemoglobin A1C: 7.4 % — AB (ref 4.0–5.6)

## 2024-11-21 MED ORDER — GLIMEPIRIDE 4 MG PO TABS: 4.0000 mg | ORAL_TABLET | Freq: Every day | ORAL | 1 refills | Status: AC

## 2024-11-21 NOTE — Assessment & Plan Note (Signed)
 LDL is  Lab Results  Component Value Date   LDLCALC 65 03/04/2024    Current medication regimen is atorvastatin .  Much improved since last year. Goal LDL is < 70.

## 2024-11-21 NOTE — Progress Notes (Signed)
 Date:  11/21/2024   Name:  Suzanne Richardson   DOB:  September 29, 1951   MRN:  [id number]   Chief Complaint: Diabetes  Diabetes She presents for her follow-up diabetic visit. She has type 2 diabetes mellitus. Her disease course has been stable. Pertinent negatives for hypoglycemia include no headaches or tremors. Pertinent negatives for diabetes include no chest pain, no fatigue, no polydipsia and no polyuria. Current diabetic treatments: MTF and glimepiride .    Review of Systems  Constitutional:  Negative for appetite change, fatigue, fever and unexpected weight change.  HENT:  Negative for tinnitus and trouble swallowing.   Eyes:  Negative for visual disturbance.  Respiratory:  Negative for cough, chest tightness and shortness of breath.   Cardiovascular:  Negative for chest pain, palpitations and leg swelling.  Gastrointestinal:  Negative for abdominal pain.  Endocrine: Negative for polydipsia and polyuria.  Genitourinary:  Negative for dysuria and hematuria.  Musculoskeletal:  Negative for arthralgias.  Neurological:  Negative for tremors, numbness and headaches.  Psychiatric/Behavioral:  Negative for dysphoric mood.      Lab Results  Component Value Date   NA 140 03/04/2024   K 4.6 03/04/2024   CO2 22 03/04/2024   GLUCOSE 133 (H) 03/04/2024   BUN 15 03/04/2024   CREATININE 0.60 03/04/2024   CALCIUM  9.6 03/04/2024   EGFR 94 03/04/2024   GFRNONAA 93 02/05/2021   Lab Results  Component Value Date   CHOL 154 03/04/2024   HDL 59 03/04/2024   LDLCALC 65 03/04/2024   TRIG 180 (H) 03/04/2024   CHOLHDL 2.6 03/04/2024   Lab Results  Component Value Date   TSH 1.700 03/04/2024   Lab Results  Component Value Date   HGBA1C 7.4 (A) 11/21/2024   Lab Results  Component Value Date   WBC 11.0 (H) 03/04/2024   HGB 13.0 03/04/2024   HCT 39.2 03/04/2024   MCV 89 03/04/2024   PLT 351 03/04/2024   Lab Results  Component Value Date   ALT 19 03/04/2024   AST 16 03/04/2024    ALKPHOS 88 03/04/2024   BILITOT <0.2 03/04/2024   No results found for: MARIEN BOLLS, VD25OH   Patient Active Problem List   Diagnosis Date Noted   Knee instability, right 07/18/2024   Restless leg syndrome 05/29/2023   Centrilobular emphysema (HCC) 09/13/2021   Pulmonary nodule 1 cm or greater in diameter 09/13/2021   Aortic atherosclerosis 07/10/2021   Hyperlipidemia associated with type 2 diabetes mellitus (HCC) 05/08/2021   Type II diabetes mellitus with complication (HCC) 02/05/2021   Tobacco use disorder, moderate, in sustained remission 02/05/2021    No Known Allergies  Past Surgical History:  Procedure Laterality Date   CATARACT EXTRACTION W/PHACO Left 07/15/2019   Procedure: CATARACT EXTRACTION PHACO AND INTRAOCULAR LENS PLACEMENT (IOC) LEFT;  Surgeon: Myrna Adine Anes, MD;  Location: [facility];  Service: Ophthalmology;  Laterality: Left;   CATARACT EXTRACTION W/PHACO Right 08/08/2019   Procedure: CATARACT EXTRACTION PHACO AND INTRAOCULAR LENS PLACEMENT (IOC) RIGHT;  Surgeon: Myrna Adine Anes, MD;  Location: [facility];  Service: Ophthalmology;  Laterality: Right;    Social History   Tobacco Use   Smoking status: Every Day    Current packs/day: 1.00    Average packs/day: 1 pack/day for 58.5 years (58.5 ttl pk-yrs)    Types: Cigarettes    Start date: 73    Last attempt to quit: 08/06/2023   Smokeless tobacco: Never   Tobacco comments:    Quit in  08/06/2023. Smoked 1PPD for 60 years. Restarted smoking January 2025.  Vaping Use   Vaping status: Never Used  Substance Use Topics   Alcohol use: Not Currently   Drug use: Never     Medication list has been reviewed and updated.  Current Meds  Medication Sig   albuterol  (VENTOLIN  HFA) 108 (90 Base) MCG/ACT inhaler INHALE 2 PUFFS BY MOUTH EVERY 6 HOURS AS NEEDED FOR WHEEZING OR SHORTNESS OF BREATH   atorvastatin  (LIPITOR) 10 MG tablet Take 1 tablet by mouth once daily   metFORMIN   (GLUCOPHAGE -XR) 500 MG 24 hr tablet TAKE 2 TABLETS BY MOUTH TWICE DAILY WITH A MEAL   Multiple Vitamin (MULTIVITAMIN) tablet Take 1 tablet by mouth daily.   rOPINIRole  (REQUIP ) 0.25 MG tablet TAKE 1 TABLET BY MOUTH AT BEDTIME AS NEEDED   [DISCONTINUED] glimepiride  (AMARYL ) 4 MG tablet TAKE 1 TABLET BY MOUTH ONCE DAILY BEFORE BREAKFAST       11/21/2024   10:52 AM 07/18/2024   10:36 AM 03/04/2024   10:45 AM 10/07/2023    9:56 AM  GAD 7 : Generalized Anxiety Score  Nervous, Anxious, on Edge 0 0 0 0  Control/stop worrying 0 0 0 0  Worry too much - different things 0 0 0 0  Trouble relaxing 0 0 0 0  Restless 0 0 0 0  Easily annoyed or irritable 0 0 0 0  Afraid - awful might happen 0 0 0 0  Total GAD 7 Score 0 0 0 0  Anxiety Difficulty Not difficult at all Not difficult at all Not difficult at all Not difficult at all       11/21/2024   10:52 AM 07/20/2024    8:56 AM 07/18/2024   10:36 AM  Depression screen PHQ 2/9  Decreased Interest 0 0 0  Down, Depressed, Hopeless 0 0 0  PHQ - 2 Score 0 0 0  Altered sleeping 0 0 0  Tired, decreased energy 0 1 1  Change in appetite 0 0 0  Feeling bad or failure about yourself  0 0 0  Trouble concentrating 0 0 0  Moving slowly or fidgety/restless 0 0 0  Suicidal thoughts 0 0 0  PHQ-9 Score 0 1  1   Difficult doing work/chores Not difficult at all Not difficult at all Not difficult at all     Data saved with a previous flowsheet row definition    BP Readings from Last 3 Encounters:  11/21/24 110/68  07/18/24 112/62  07/15/24 112/62    Physical Exam Vitals and nursing note reviewed.  Constitutional:      General: She is not in acute distress.    Appearance: Normal appearance. She is well-developed.  HENT:     Head: Normocephalic and atraumatic.  Neck:     Vascular: No carotid bruit.  Cardiovascular:     Rate and Rhythm: Normal rate and regular rhythm.  Pulmonary:     Effort: Pulmonary effort is normal. No respiratory distress.      Breath sounds: Transmitted upper airway sounds present. No wheezing or rhonchi.  Musculoskeletal:     Cervical back: Normal range of motion.  Lymphadenopathy:     Cervical: No cervical adenopathy.  Skin:    General: Skin is warm and dry.     Findings: No rash.  Neurological:     Mental Status: She is alert and oriented to person, place, and time.  Psychiatric:        Mood and Affect: Mood normal.  Behavior: Behavior normal.    Diabetic Foot Exam - Simple   Simple Foot Form Diabetic Foot exam was performed with the following findings: Yes 11/21/2024 11:13 AM  Visual Inspection No deformities, no ulcerations, no other skin breakdown bilaterally: Yes Sensation Testing Intact to touch and monofilament testing bilaterally: Yes Pulse Check Posterior Tibialis and Dorsalis pulse intact bilaterally: Yes Comments      Wt Readings from Last 3 Encounters:  11/21/24 161 lb (73 kg)  07/20/24 165 lb (74.8 kg)  07/18/24 164 lb 3.2 oz (74.5 kg)    BP 110/68   Pulse 68   Ht 5' 6 (1.676 m)   Wt 161 lb (73 kg)   SpO2 97%   BMI 25.99 kg/m   Assessment and Plan:  Problem List Items Addressed This Visit       Unprioritized   Type II diabetes mellitus with complication (HCC) - Primary   Currently medications are Januvia  and Amaryl .  No hypoglycemic episodes noted. Home blood sugars in the 120-150 range. Last visit medical regimen changes were none. Lab Results  Component Value Date   HGBA1C 7.0 (A) 07/18/2024  A1C today =7.4.  reminded to work on diet changes.        Relevant Medications   glimepiride  (AMARYL ) 4 MG tablet   Other Relevant Orders   POCT glycosylated hemoglobin (Hb A1C) (Completed)   Microalbumin / creatinine urine ratio   Hyperlipidemia associated with type 2 diabetes mellitus (HCC) (Chronic)   LDL is  Lab Results  Component Value Date   LDLCALC 65 03/04/2024    Current medication regimen is atorvastatin .  Much improved since last year. Goal LDL  is < 70.       Relevant Medications   glimepiride  (AMARYL ) 4 MG tablet   Other Visit Diagnoses       Long term current use of oral hypoglycemic drug           Return in about 4 months (around 03/21/2025) for DM, HTN  Dr. Lemon.    Leita HILARIO Adie, MD [facility] [place]

## 2024-11-21 NOTE — Assessment & Plan Note (Signed)
 Currently medications are Januvia  and Amaryl .  No hypoglycemic episodes noted. Home blood sugars in the 120-150 range. Last visit medical regimen changes were none. Lab Results  Component Value Date   HGBA1C 7.0 (A) 07/18/2024  A1C today =7.4.  reminded to work on diet changes.

## 2024-12-23 ENCOUNTER — Other Ambulatory Visit: Payer: Self-pay | Admitting: Internal Medicine

## 2024-12-23 DIAGNOSIS — E119 Type 2 diabetes mellitus without complications: Secondary | ICD-10-CM

## 2024-12-26 NOTE — Telephone Encounter (Signed)
 Requested Prescriptions  Pending Prescriptions Disp Refills   metFORMIN  (GLUCOPHAGE -XR) 500 MG 24 hr tablet [Pharmacy Med Name: metFORMIN  HCl ER 500 MG Oral Tablet Extended Release 24 Hour] 360 tablet 1    Sig: TAKE 2 TABLETS BY MOUTH TWICE DAILY WITH A MEAL     Endocrinology:  Diabetes - Biguanides Failed - 12/26/2024 12:47 PM      Failed - B12 Level in normal range and within 720 days    No results found for: VITAMINB12       Passed - Cr in normal range and within 360 days    Creatinine, Ser  Date Value Ref Range Status  03/04/2024 0.60 0.57 - 1.00 mg/dL Final         Passed - HBA1C is between 0 and 7.9 and within 180 days    Hemoglobin A1C  Date Value Ref Range Status  11/21/2024 7.4 (A) 4.0 - 5.6 % Final   Hgb A1c MFr Bld  Date Value Ref Range Status  03/04/2024 8.0 (H) 4.8 - 5.6 % Final    Comment:             Prediabetes: 5.7 - 6.4          Diabetes: >6.4          Glycemic control for adults with diabetes: <7.0          Passed - eGFR in normal range and within 360 days    GFR calc Af Amer  Date Value Ref Range Status  02/05/2021 107 >59 mL/min/1.73 Final    Comment:    **In accordance with recommendations from the NKF-ASN Task force,**   [facility] is in the process of updating its eGFR calculation to the   2021 CKD-EPI creatinine equation that estimates kidney function   without a race variable.    GFR calc non Af Amer  Date Value Ref Range Status  02/05/2021 93 >59 mL/min/1.73 Final   eGFR  Date Value Ref Range Status  03/04/2024 94 >59 mL/min/1.73 Final         Passed - Valid encounter within last 6 months    Recent Outpatient Visits           1 month ago Type II diabetes mellitus with complication [facility])   [place] [facility] at [facility], Leita DEL, MD   5 months ago Type II diabetes mellitus with complication [facility])   [place] [facility] at [facility], Leita DEL, MD   9 months  ago Diabetes mellitus treated with oral medication [facility])   [place] [facility] at [facility], Leita DEL, MD              Passed - CBC within normal limits and completed in the last 12 months    WBC  Date Value Ref Range Status  03/04/2024 11.0 (H) 3.4 - 10.8 x10E3/uL Final   RBC  Date Value Ref Range Status  03/04/2024 4.40 3.77 - 5.28 x10E6/uL Final   Hemoglobin  Date Value Ref Range Status  03/04/2024 13.0 11.1 - 15.9 g/dL Final   Hematocrit  Date Value Ref Range Status  03/04/2024 39.2 34.0 - 46.6 % Final   MCHC  Date Value Ref Range Status  03/04/2024 33.2 31.5 - 35.7 g/dL Final   [facility]  Date Value Ref Range Status  03/04/2024 29.5 26.6 - 33.0 pg Final   MCV  Date Value Ref Range  Status  03/04/2024 89 79 - 97 fL Final   No results found for: PLTCOUNTKUC, LABPLAT, POCPLA RDW  Date Value Ref Range Status  03/04/2024 14.5 11.7 - 15.4 % Final

## 2025-03-24 ENCOUNTER — Ambulatory Visit: Admitting: Student

## 2025-07-26 ENCOUNTER — Ambulatory Visit
